# Patient Record
Sex: Female | Born: 1955
Health system: Southern US, Community
[De-identification: ages and names within clinical notes are randomized; demographics above are authoritative.]

## PROBLEM LIST (undated history)

## (undated) DIAGNOSIS — E785 Hyperlipidemia, unspecified: Secondary | ICD-10-CM

## (undated) DIAGNOSIS — E559 Vitamin D deficiency, unspecified: Secondary | ICD-10-CM

## (undated) DIAGNOSIS — I10 Essential (primary) hypertension: Secondary | ICD-10-CM

## (undated) DIAGNOSIS — H269 Unspecified cataract: Secondary | ICD-10-CM

## (undated) DIAGNOSIS — M8580 Other specified disorders of bone density and structure, unspecified site: Secondary | ICD-10-CM

## (undated) DIAGNOSIS — T7840XA Allergy, unspecified, initial encounter: Secondary | ICD-10-CM

## (undated) HISTORY — DX: Essential (primary) hypertension: I10

## (undated) HISTORY — PX: EYE SURGERY: SHX253

## (undated) HISTORY — DX: Allergy, unspecified, initial encounter: T78.40XA

## (undated) HISTORY — DX: Hyperlipidemia, unspecified: E78.5

## (undated) HISTORY — PX: NO PAST SURGERIES: SHX2092

## (undated) HISTORY — DX: Other specified disorders of bone density and structure, unspecified site: M85.80

## (undated) HISTORY — DX: Vitamin D deficiency, unspecified: E55.9

## (undated) HISTORY — DX: Unspecified cataract: H26.9

---

## 2015-12-25 DIAGNOSIS — H5203 Hypermetropia, bilateral: Secondary | ICD-10-CM | POA: Diagnosis not present

## 2015-12-25 DIAGNOSIS — H2513 Age-related nuclear cataract, bilateral: Secondary | ICD-10-CM | POA: Diagnosis not present

## 2016-07-24 ENCOUNTER — Encounter (INDEPENDENT_AMBULATORY_CARE_PROVIDER_SITE_OTHER): Payer: Self-pay

## 2016-07-24 ENCOUNTER — Ambulatory Visit (INDEPENDENT_AMBULATORY_CARE_PROVIDER_SITE_OTHER): Payer: BLUE CROSS/BLUE SHIELD | Admitting: Pediatrics

## 2016-07-24 ENCOUNTER — Encounter: Payer: Self-pay | Admitting: Pediatrics

## 2016-07-24 VITALS — BP 156/83 | HR 78 | Temp 97.3°F | Ht 64.0 in | Wt 218.6 lb

## 2016-07-24 DIAGNOSIS — K1379 Other lesions of oral mucosa: Secondary | ICD-10-CM

## 2016-07-24 DIAGNOSIS — Z1159 Encounter for screening for other viral diseases: Secondary | ICD-10-CM | POA: Diagnosis not present

## 2016-07-24 DIAGNOSIS — Z1231 Encounter for screening mammogram for malignant neoplasm of breast: Secondary | ICD-10-CM | POA: Diagnosis not present

## 2016-07-24 DIAGNOSIS — K1370 Unspecified lesions of oral mucosa: Secondary | ICD-10-CM

## 2016-07-24 DIAGNOSIS — Z6837 Body mass index (BMI) 37.0-37.9, adult: Secondary | ICD-10-CM

## 2016-07-24 DIAGNOSIS — I10 Essential (primary) hypertension: Secondary | ICD-10-CM | POA: Diagnosis not present

## 2016-07-24 DIAGNOSIS — Z1239 Encounter for other screening for malignant neoplasm of breast: Secondary | ICD-10-CM

## 2016-07-24 LAB — BAYER DCA HB A1C WAIVED: HB A1C: 5.9 % (ref <7.0)

## 2016-07-24 MED ORDER — LOSARTAN POTASSIUM 50 MG PO TABS: 50.0000 mg | ORAL_TABLET | Freq: Every day | ORAL | 3 refills | Status: DC

## 2016-07-24 NOTE — Progress Notes (Addendum)
Subjective:   Patient ID: Tammy Lawson, female    DOB: 07/03/1955, 61 y.o.   MRN: [id number] CC: New Patient (Initial Visit) f/u multiple med problems HPI: Tammy Lawson is a 61 y.o. female presenting for New Patient (Initial Visit)  Elevated blood pressure: At home 140s/70s regularly Checking about once a week No HA, no CP, SOB Never bene on medication before Has been some years since last saw a doctor  Never had colonoscopy Not getting mammograms regularly Due for pap smear  Elevated BMI: Decreasing salt for husband  Has bump on upper gum R upper side of mouth Was tender at first, throbbing Has h/o sinus problems Was seen by dentist, told she had LAD, that has now improved Bump still there, still throbs sometimes, is not tender when pressed on  Dad with sinus tumor, prostate, colon cancer in his 36s, lung cancer Sister with stage IV cancer, possibly lung cancer primary  No past medical history on file. Family History  Problem Relation Age of Onset  . Diabetes Mother   . Heart disease Mother   . Stroke Mother   . Cancer Father   . COPD Father   . Hypertension Father   . Cancer Sister    Social History   Social History  . Marital status: Married    Spouse name: N/A  . Number of children: N/A  . Years of education: N/A   Social History Main Topics  . Smoking status: Never Smoker  . Smokeless tobacco: Never Used  . Alcohol use No  . Drug use: No  . Sexual activity: Not on file   Other Topics Concern  . Not on file   Social History Narrative  . No narrative on file   ROS: All systems negative other than what is in HPI  Objective:    BP (!) 156/83   Pulse 78   Temp 97.3 F (36.3 C) (Oral)   Ht 5' 4" (1.626 m)   Wt 218 lb 9.6 oz (99.2 kg)   BMI 37.52 kg/m   Wt Readings from Last 3 Encounters:  07/24/16 218 lb 9.6 oz (99.2 kg)    Gen: NAD, alert, cooperative with exam, NCAT EYES: EOMI, no conjunctival injection, or no icterus ENT:  TMs pearly  gray b/l, more dull L side, OP without erythema, R upper gum over outside of teeth with apprx 0.5 cm hard mass, non-tender, mucosa intact LYMPH: no cervical LAD CV: NRRR, normal S1/S2, no murmur, distal pulses 2+ b/l Resp: CTABL, no wheezes, normal WOB Ext: No edema, warm Neuro: Alert and oriented, strength equal b/l UE and LE, coordination grossly normal  Assessment & Plan:  Tammy Lawson was seen today for new patient (initial visit).  Diagnoses and all orders for this visit:  Essential hypertension Elevated BP today Start below, check BPs at home -     losartan (COZAAR) 50 MG tablet; Take 1 tablet (50 mg total) by mouth daily.  BMI 37.0-37.9, adult Discussed lifestyle changes, increasing activity, decreasing sugar intake -     Lipid panel -     BMP8+EGFR -     Bayer DCA Hb A1c Waived -     TSH  Screening for breast cancer -     MM DIGITAL SCREENING BILATERAL; Future  Need for hepatitis C screening test -     Hepatitis C antibody  Mass of mouth Present for several weeks Throbbing at times -     Ambulatory referral to oral surgeon based on pt pref,  dentist recommendations   Follow up plan: Return in about 4 weeks (around 08/21/2016) for CPE. Assunta Found, MD [place]

## 2016-07-25 ENCOUNTER — Telehealth: Payer: Self-pay | Admitting: Pediatrics

## 2016-07-25 DIAGNOSIS — K068 Other specified disorders of gingiva and edentulous alveolar ridge: Secondary | ICD-10-CM

## 2016-07-25 LAB — LIPID PANEL
CHOL/HDL RATIO: 5.1 ratio — AB (ref 0.0–4.4)
Cholesterol, Total: 195 mg/dL (ref 100–199)
HDL: 38 mg/dL — AB (ref >39)
LDL CALC: 130 mg/dL — AB (ref 0–99)
Triglycerides: 135 mg/dL (ref 0–149)
VLDL CHOLESTEROL CAL: 27 mg/dL (ref 5–40)

## 2016-07-25 LAB — BMP8+EGFR
BUN / CREAT RATIO: 17 (ref 12–28)
BUN: 12 mg/dL (ref 8–27)
CO2: 25 mmol/L (ref 18–29)
Calcium: 9.5 mg/dL (ref 8.7–10.3)
Chloride: 100 mmol/L (ref 96–106)
Creatinine, Ser: 0.7 mg/dL (ref 0.57–1.00)
GFR calc Af Amer: 108 mL/min/{1.73_m2} (ref >59)
GFR calc non Af Amer: 94 mL/min/{1.73_m2} (ref >59)
Glucose: 107 mg/dL — ABNORMAL HIGH (ref 65–99)
POTASSIUM: 4.4 mmol/L (ref 3.5–5.2)
SODIUM: 142 mmol/L (ref 134–144)

## 2016-07-25 LAB — TSH: TSH: 0.542 u[IU]/mL (ref 0.450–4.500)

## 2016-07-25 LAB — HEPATITIS C ANTIBODY

## 2016-07-25 NOTE — Addendum Note (Signed)
Addended by: Eustaquio Maize on: 07/25/2016 01:44 PM   Modules accepted: Orders

## 2016-07-25 NOTE — Telephone Encounter (Signed)
ordered

## 2016-08-06 DIAGNOSIS — D164 Benign neoplasm of bones of skull and face: Secondary | ICD-10-CM | POA: Diagnosis not present

## 2016-08-26 ENCOUNTER — Ambulatory Visit (INDEPENDENT_AMBULATORY_CARE_PROVIDER_SITE_OTHER): Payer: BLUE CROSS/BLUE SHIELD | Admitting: Pediatrics

## 2016-08-26 ENCOUNTER — Encounter: Payer: Self-pay | Admitting: Pediatrics

## 2016-08-26 VITALS — BP 137/75 | HR 87 | Temp 97.1°F | Ht 64.0 in | Wt 213.6 lb

## 2016-08-26 DIAGNOSIS — Z0000 Encounter for general adult medical examination without abnormal findings: Secondary | ICD-10-CM

## 2016-08-26 DIAGNOSIS — N898 Other specified noninflammatory disorders of vagina: Secondary | ICD-10-CM

## 2016-08-26 DIAGNOSIS — N939 Abnormal uterine and vaginal bleeding, unspecified: Secondary | ICD-10-CM

## 2016-08-26 DIAGNOSIS — Z1211 Encounter for screening for malignant neoplasm of colon: Secondary | ICD-10-CM

## 2016-08-26 LAB — WET PREP FOR TRICH, YEAST, CLUE
CLUE CELL EXAM: NEGATIVE
TRICHOMONAS EXAM: NEGATIVE
YEAST EXAM: NEGATIVE

## 2016-08-26 NOTE — Progress Notes (Signed)
  Subjective:   Patient ID: Tammy Lawson, female    DOB: 01/13/1956, 61 y.o.   MRN: [id number] CC: Annual Exam  HPI: Tammy Lawson is a 61 y.o. female presenting for Annual Exam  Cyst in mouth taken care of, had root canal done, told not cancer Mammo: appt set up July 30 Pap smear: no hx of abnormal, last 15-20 yrs Has noticed some vaginal discharge, white, no itchiness, no smell Not sexually active now Not worried about STIs Has never had regular periods Now spotting almost weekly for 1-2 days Previously was having periods every 2-3 months Thinks she went through menopause several years ago   HTN: at home BPs 120s/70s As low as 107/69 Checks regularly at home  No fam hx of breast ca, dad had colon ca in 80s, pt does not want colonoscopy  Not eating ice cream eating every, sticking with half a cup of ice cream  Relevant past medical, surgical, family and social history reviewed. Allergies and medications reviewed and updated. History  Smoking Status  . Never Smoker  Smokeless Tobacco  . Never Used   ROS: All systems negative other than what is in HPI  Objective:    BP 137/75   Pulse 87   Temp 97.1 F (36.2 C) (Oral)   Ht 5\' 4"  (1.626 m)   Wt 213 lb 9.6 oz (96.9 kg)   BMI 36.66 kg/m   Wt Readings from Last 3 Encounters:  08/26/16 213 lb 9.6 oz (96.9 kg)  07/24/16 218 lb 9.6 oz (99.2 kg)    Gen: NAD, alert, cooperative with exam, NCAT EYES: EOMI, no conjunctival injection, or no icterus ENT:  TMs pearly gray b/l, OP without erythema LYMPH: no cervical LAD CV: NRRR, normal S1/S2, no murmur, distal pulses 2+ b/l Resp: CTABL, no wheezes, normal WOB Abd: +BS, soft, NTND. no guarding or organomegaly Ext: No edema, warm Neuro: Alert and oriented, strength equal b/l UE and LE, coordination grossly normal MSK: normal muscle bulk Breast: nl b/l GU: nl external female genitalia, normal appearing cervix, small amount green discharge, slight amount blood at cervical  os  Assessment & Plan:  Tammy Lawson was seen today for annual exam.  Diagnoses and all orders for this visit:  Encounter for preventive health examination Mammogram upcoming Declined colonoscopy, doing FIT test as below -     Pap IG and HPV (high risk) DNA detection  Vaginal discharge -     WET PREP FOR [place], YEAST, CLUE  Vaginal bleeding -     Ambulatory referral to Obstetrics / Gynecology  Colon cancer screening -     Fecal occult blood, imunochemical; Future   Follow up plan: Return in about 6 months (around 02/25/2017). Assunta Found, MD [place]

## 2016-08-28 LAB — PAP IG AND HPV HIGH-RISK
HPV, high-risk: NEGATIVE
PAP Smear Comment: 0

## 2016-09-05 ENCOUNTER — Encounter: Payer: Self-pay | Admitting: Obstetrics & Gynecology

## 2016-09-14 ENCOUNTER — Ambulatory Visit (INDEPENDENT_AMBULATORY_CARE_PROVIDER_SITE_OTHER): Payer: BLUE CROSS/BLUE SHIELD | Admitting: Nurse Practitioner

## 2016-09-14 ENCOUNTER — Encounter: Payer: Self-pay | Admitting: Nurse Practitioner

## 2016-09-14 VITALS — BP 130/82 | HR 80 | Temp 100.9°F | Ht 64.0 in | Wt 209.0 lb

## 2016-09-14 DIAGNOSIS — J029 Acute pharyngitis, unspecified: Secondary | ICD-10-CM | POA: Diagnosis not present

## 2016-09-14 DIAGNOSIS — J020 Streptococcal pharyngitis: Secondary | ICD-10-CM | POA: Diagnosis not present

## 2016-09-14 MED ORDER — AMOXICILLIN 875 MG PO TABS: 875.0000 mg | ORAL_TABLET | Freq: Two times a day (BID) | ORAL | 0 refills | Status: DC

## 2016-09-14 NOTE — Patient Instructions (Signed)

## 2016-09-14 NOTE — Progress Notes (Signed)
   Subjective:    Patient ID: Tammy Lawson, female    DOB: 05-04-1956, 61 y.o.   MRN: [id number]  HPI Patient is brought in by hr husband today with c/o sore troat for 2 days. Ran a fever last night of 101.2.    Review of Systems  Constitutional: Positive for appetite change (decreased), chills and fever.  HENT: Positive for congestion, sore throat, trouble swallowing and voice change (froggy). Negative for ear pain and sinus pressure.   Respiratory: Positive for cough (mild nonproductive cough).   Cardiovascular: Negative.   Genitourinary: Negative.   Neurological: Negative.   Psychiatric/Behavioral: Negative.        Objective:   Physical Exam  Constitutional: She is oriented to person, place, and time. She appears well-developed and well-nourished. No distress.  HENT:  Right Ear: Hearing, tympanic membrane, external ear and ear canal normal.  Left Ear: Hearing, tympanic membrane, external ear and ear canal normal.  Nose: Mucosal edema and rhinorrhea present. Right sinus exhibits no maxillary sinus tenderness and no frontal sinus tenderness. Left sinus exhibits no maxillary sinus tenderness and no frontal sinus tenderness.  Mouth/Throat: Uvula is midline. Posterior oropharyngeal erythema present.  Neck: Normal range of motion. Neck supple.  Cardiovascular: Normal rate and regular rhythm.   Pulmonary/Chest: Effort normal and breath sounds normal.  Lymphadenopathy:    She has cervical adenopathy (anterior cervical bil).  Neurological: She is alert and oriented to person, place, and time.  Skin: Skin is warm.  Psychiatric: She has a normal mood and affect. Her behavior is normal. Judgment and thought content normal.   BP 130/82   Pulse 80   Temp (!) 100.9 F (38.3 C) (Oral)   Ht 5\' 4"  (1.626 m)   Wt 209 lb (94.8 kg)   BMI 35.87 kg/m   Strep test -positive     Assessment & Plan:   1. Sore throat   2. Strep pharyngitis    Meds ordered this encounter  Medications  .  amoxicillin (AMOXIL) 875 MG tablet    Sig: Take 1 tablet (875 mg total) by mouth 2 (two) times daily. 1 po BID    Dispense:  20 tablet    Refill:  0    Order Specific Question:   Supervising Provider    Answer:   Eustaquio Maize [[id number]]   Force fluids Motrin or tylenol OTC OTC decongestant Throat lozenges if help New toothbrush in 3 days  Mary-Margaret Hassell Done, FNP

## 2016-09-16 LAB — RAPID STREP SCREEN (MED CTR MEBANE ONLY): Strep Gp A Ag, IA W/Reflex: POSITIVE — AB

## 2016-09-17 ENCOUNTER — Telehealth: Payer: Self-pay | Admitting: Pediatrics

## 2016-09-17 NOTE — Telephone Encounter (Signed)
Patient aware.

## 2016-09-17 NOTE — Telephone Encounter (Signed)
Patient's throat is feeling better, but she is still running a slight fever of 100.4.  She is taking medication for the fever.  Do you recommend she make any changes?

## 2016-09-17 NOTE — Telephone Encounter (Signed)
No just continue antibiotic as rx and take motrin for fever

## 2016-09-19 ENCOUNTER — Ambulatory Visit (INDEPENDENT_AMBULATORY_CARE_PROVIDER_SITE_OTHER): Payer: BLUE CROSS/BLUE SHIELD | Admitting: Nurse Practitioner

## 2016-09-19 ENCOUNTER — Encounter: Payer: Self-pay | Admitting: Nurse Practitioner

## 2016-09-19 VITALS — HR 113 | Temp 99.4°F | Ht 64.0 in | Wt 208.0 lb

## 2016-09-19 DIAGNOSIS — J020 Streptococcal pharyngitis: Secondary | ICD-10-CM | POA: Diagnosis not present

## 2016-09-19 DIAGNOSIS — R509 Fever, unspecified: Secondary | ICD-10-CM

## 2016-09-19 LAB — VERITOR FLU A/B WAIVED
INFLUENZA B: NEGATIVE
Influenza A: NEGATIVE

## 2016-09-19 MED ORDER — CEFDINIR 300 MG PO CAPS: 300.0000 mg | ORAL_CAPSULE | Freq: Two times a day (BID) | ORAL | 0 refills | Status: DC

## 2016-09-19 NOTE — Progress Notes (Signed)
   Subjective:    Patient ID: [name], female    DOB: 1956/03/09, 61 y.o.   MRN: [id number]  HPI Patient was seen on 09/14/16 with sore throat and fever- tested positive for strep and was put on amoxicillin. Today she comes in with c/o still running fever- feesl achy- Several years ago she had strep and she had to take 2 roundsof antibiotic for it to resolve.   Review of Systems  Constitutional: Positive for chills, fatigue and fever.  HENT: Positive for congestion, rhinorrhea and sore throat (improved). Negative for sinus pain and sinus pressure.   Respiratory: Positive for cough (only slight).   Cardiovascular: Negative.   Gastrointestinal: Negative.   Genitourinary: Negative.   Neurological: Negative.   Psychiatric/Behavioral: Negative.   All other systems reviewed and are negative.      Objective:   Physical Exam  Constitutional: She appears well-developed and well-nourished. No distress.  HENT:  Right Ear: External ear normal.  Left Ear: External ear normal.  Nose: Nose normal.  Mouth/Throat: Oropharynx is clear and moist. No oropharyngeal exudate.  Neck: Normal range of motion. Neck supple.  Cardiovascular: Normal rate and regular rhythm.   Pulmonary/Chest: Effort normal and breath sounds normal.  Abdominal: Soft. Bowel sounds are normal.  Lymphadenopathy:    She has no cervical adenopathy.  Psychiatric: She has a normal mood and affect. Her behavior is normal. Judgment and thought content normal.    Pulse (!) 113   Temp 99.4 F (37.4 C) (Oral)   Ht 5\' 4"  (1.626 m)   Wt 208 lb (94.3 kg)   BMI 35.70 kg/m   Flu negative     Assessment & Plan:   1. Fever, unspecified fever cause   2. Strep pharyngitis    Stop amoxicillin Meds ordered this encounter  Medications  . cefdinir (OMNICEF) 300 MG capsule    Sig: Take 1 capsule (300 mg total) by mouth 2 (two) times daily. 1 po BID    Dispense:  20 capsule    Refill:  0    Order Specific Question:   Supervising  Provider    Answer:   Eustaquio Maize [[id number]]   Force fluids Motrin or tylenol OTC OTC decongestant Throat lozenges if help New toothbrush in 3 days RTO if not improving  Mary-Margaret Hassell Done, FNP

## 2016-10-15 ENCOUNTER — Encounter: Payer: BLUE CROSS/BLUE SHIELD | Admitting: Adult Health

## 2016-11-09 ENCOUNTER — Other Ambulatory Visit: Payer: Self-pay | Admitting: Pediatrics

## 2016-11-09 DIAGNOSIS — I10 Essential (primary) hypertension: Secondary | ICD-10-CM

## 2016-12-02 ENCOUNTER — Encounter: Payer: BLUE CROSS/BLUE SHIELD | Admitting: *Deleted

## 2016-12-02 DIAGNOSIS — Z1231 Encounter for screening mammogram for malignant neoplasm of breast: Secondary | ICD-10-CM | POA: Diagnosis not present

## 2016-12-20 ENCOUNTER — Telehealth: Payer: Self-pay | Admitting: *Deleted

## 2016-12-20 NOTE — Telephone Encounter (Signed)
pt was having abdominal pain from waist down that was coming and going and broke out in a sweat and felt nauseated. She also states her head felt "funny" like she might pass out but didn't. Called EMS and they told her she probably was dehydrated. Pt scheduled 8/20 at 10:30 with [facility] for eval. Advised to call 911 or ED if chest pain, SOB or feels like she may pass out. Pt voiced understanding.

## 2016-12-23 ENCOUNTER — Ambulatory Visit (INDEPENDENT_AMBULATORY_CARE_PROVIDER_SITE_OTHER): Payer: BLUE CROSS/BLUE SHIELD | Admitting: Nurse Practitioner

## 2016-12-23 ENCOUNTER — Encounter: Payer: Self-pay | Admitting: Nurse Practitioner

## 2016-12-23 VITALS — BP 136/82 | HR 62 | Temp 97.8°F | Ht 64.0 in | Wt 212.0 lb

## 2016-12-23 DIAGNOSIS — Z1211 Encounter for screening for malignant neoplasm of colon: Secondary | ICD-10-CM | POA: Diagnosis not present

## 2016-12-23 DIAGNOSIS — R109 Unspecified abdominal pain: Secondary | ICD-10-CM | POA: Diagnosis not present

## 2016-12-23 NOTE — Patient Instructions (Signed)

## 2016-12-23 NOTE — Progress Notes (Signed)
   Subjective:    Patient ID: Tammy Lawson, female    DOB: Mar 14, 1956, 61 y.o.   MRN: [id number]  HPI Patient comes in today c/o that Friday she was sitting out on her deck. All the sudden started having abdominal pain and sweating. Lasted about 45-to 1 hour. SHe called EMS and thay said blood pressure was 130/76. Blood sugar 102. They thought may have been heat exhaustion and they suggest she follow up today to make sure her heart was ok. Has had no more episodes since then. Feels fine today.    Review of Systems  Constitutional: Negative for activity change, appetite change, chills and fever.  Respiratory: Negative.  Negative for shortness of breath.   Cardiovascular: Negative for chest pain.  Gastrointestinal: Negative for abdominal distention, abdominal pain, constipation, diarrhea, nausea and vomiting.  Genitourinary: Negative.   Neurological: Negative.   Psychiatric/Behavioral: Negative.   All other systems reviewed and are negative.      Objective:   Physical Exam  Constitutional: She is oriented to person, place, and time. She appears well-developed and well-nourished. No distress.  Cardiovascular: Normal rate and regular rhythm.   Pulmonary/Chest: Effort normal and breath sounds normal.  Abdominal: Soft. Bowel sounds are normal.  Neurological: She is alert and oriented to person, place, and time.  Skin: Skin is warm.  Psychiatric: She has a normal mood and affect. Her behavior is normal. Judgment and thought content normal.   BP 136/82 (BP Location: Left Arm, Cuff Size: Normal)   Pulse 62   Temp 97.8 F (36.6 C) (Oral)   Ht 5\' 4"  (1.626 m)   Wt 212 lb (96.2 kg)   SpO2 100%   BMI 36.39 kg/m   EKG- NSR with right Cruz Condon, FNP       Assessment & Plan:   1. Abdominal pain, unspecified abdominal location   2. Colon cancer screening    Pain resolved- if reoccurs go to ER or RTO Avoid greasy and fatty foods Hemoccult cards returned today Follow up  prn  Mary-Margaret Hassell Done, FNP

## 2016-12-26 LAB — FECAL OCCULT BLOOD, IMMUNOCHEMICAL: FECAL OCCULT BLD: NEGATIVE

## 2017-01-21 ENCOUNTER — Encounter: Payer: BLUE CROSS/BLUE SHIELD | Admitting: Adult Health

## 2017-02-20 ENCOUNTER — Ambulatory Visit (INDEPENDENT_AMBULATORY_CARE_PROVIDER_SITE_OTHER): Payer: BLUE CROSS/BLUE SHIELD | Admitting: Pediatrics

## 2017-02-20 ENCOUNTER — Encounter: Payer: Self-pay | Admitting: Pediatrics

## 2017-02-20 VITALS — BP 146/81 | HR 84 | Temp 97.7°F | Ht 64.0 in | Wt 211.8 lb

## 2017-02-20 DIAGNOSIS — R7303 Prediabetes: Secondary | ICD-10-CM | POA: Diagnosis not present

## 2017-02-20 DIAGNOSIS — R1011 Right upper quadrant pain: Secondary | ICD-10-CM

## 2017-02-20 DIAGNOSIS — Z6836 Body mass index (BMI) 36.0-36.9, adult: Secondary | ICD-10-CM | POA: Diagnosis not present

## 2017-02-20 DIAGNOSIS — I10 Essential (primary) hypertension: Secondary | ICD-10-CM

## 2017-02-20 DIAGNOSIS — Z6835 Body mass index (BMI) 35.0-35.9, adult: Secondary | ICD-10-CM

## 2017-02-20 LAB — CMP14+EGFR
ALBUMIN: 4.6 g/dL (ref 3.6–4.8)
ALT: 16 IU/L (ref 0–32)
AST: 20 IU/L (ref 0–40)
Albumin/Globulin Ratio: 1.8 (ref 1.2–2.2)
Alkaline Phosphatase: 81 IU/L (ref 39–117)
BUN / CREAT RATIO: 16 (ref 12–28)
BUN: 11 mg/dL (ref 8–27)
Bilirubin Total: 0.5 mg/dL (ref 0.0–1.2)
CALCIUM: 9.5 mg/dL (ref 8.7–10.3)
CO2: 25 mmol/L (ref 20–29)
CREATININE: 0.68 mg/dL (ref 0.57–1.00)
Chloride: 103 mmol/L (ref 96–106)
GFR calc Af Amer: 108 mL/min/{1.73_m2} (ref >59)
GFR, EST NON AFRICAN AMERICAN: 94 mL/min/{1.73_m2} (ref >59)
GLOBULIN, TOTAL: 2.5 g/dL (ref 1.5–4.5)
Glucose: 95 mg/dL (ref 65–99)
Potassium: 4.6 mmol/L (ref 3.5–5.2)
SODIUM: 142 mmol/L (ref 134–144)
Total Protein: 7.1 g/dL (ref 6.0–8.5)

## 2017-02-20 LAB — CBC WITH DIFFERENTIAL/PLATELET
Basophils Absolute: 0 10*3/uL (ref 0.0–0.2)
Basos: 1 %
EOS (ABSOLUTE): 0.1 10*3/uL (ref 0.0–0.4)
EOS: 2 %
HEMATOCRIT: 41.7 % (ref 34.0–46.6)
HEMOGLOBIN: 13.5 g/dL (ref 11.1–15.9)
IMMATURE GRANS (ABS): 0 10*3/uL (ref 0.0–0.1)
IMMATURE GRANULOCYTES: 0 %
LYMPHS ABS: 1.4 10*3/uL (ref 0.7–3.1)
LYMPHS: 23 %
MCH: 29.4 pg (ref 26.6–33.0)
MCHC: 32.4 g/dL (ref 31.5–35.7)
MCV: 91 fL (ref 79–97)
MONOCYTES: 5 %
Monocytes Absolute: 0.3 10*3/uL (ref 0.1–0.9)
NEUTROS PCT: 69 %
Neutrophils Absolute: 4.2 10*3/uL (ref 1.4–7.0)
Platelets: 246 10*3/uL (ref 150–379)
RBC: 4.59 x10E6/uL (ref 3.77–5.28)
RDW: 14 % (ref 12.3–15.4)
WBC: 6 10*3/uL (ref 3.4–10.8)

## 2017-02-20 MED ORDER — LOSARTAN POTASSIUM 50 MG PO TABS: 50.0000 mg | ORAL_TABLET | Freq: Every day | ORAL | 1 refills | Status: DC

## 2017-02-20 NOTE — Progress Notes (Signed)
  Subjective:   Patient ID: Tammy Lawson, female    DOB: March 28, 1956, 61 y.o.   MRN: [id number] CC: Follow-up (6 month) multiple med problems  HPI: Tammy Lawson is a 61 y.o. female presenting for Follow-up (6 month)  HTN: No HA, no vision changes Checks BPs regularly at home Usually 110s-120s Sometimes 140 SBP initially, within 5 mints 130s or less with rechecking Taking med regularly  Elevated BMI:  Gardens regularly [employer] grandson Not drinking sodas Cutting out ice cream, replacing it with fruit  Has a pain/discomfort in R side comes on at random times, first noticed when pregnant with son, has felt the same off and on since then Worse with greasy foods but not always associated with food Called EMS 2 mo ago for mid abd pain that lasted about 3 hrs, started at 11am, not associated with food Seen in clinic following event Denies chest pain, SOB with exertion No fevers Pain happens about once a month, ongoing for about a year  Relevant past medical, surgical, family and social history reviewed. Allergies and medications reviewed and updated. History  Smoking Status  . Never Smoker  Smokeless Tobacco  . Never Used   ROS: Per HPI   Objective:    BP (!) 143/83   Pulse 65   Temp 97.7 F (36.5 C) (Oral)   Ht '5\' 4"'$  (1.626 m)   Wt 211 lb 12.8 oz (96.1 kg)   BMI 36.36 kg/m   Wt Readings from Last 3 Encounters:  02/20/17 211 lb 12.8 oz (96.1 kg)  12/23/16 212 lb (96.2 kg)  09/19/16 208 lb (94.3 kg)    Gen: NAD, alert, cooperative with exam, NCAT EYES: EOMI, no conjunctival injection, or no icterus CV: NRRR, normal S1/S2, no murmur, distal pulses 2+ b/l Resp: CTABL, no wheezes, normal WOB Abd: +BS, soft, NTND. no guarding or organomegaly, neg murphys sign Ext: No edema, warm Neuro: Alert and oriented, strength equal b/l UE and LE, coordination grossly normal, normal gait MSK: normal muscle bulk  Assessment & Plan:  Tammy Lawson was seen today for follow-up med problems,  abd pain  Diagnoses and all orders for this visit:  RUQ pain Intermittent, bothering her more often, associated with food at times Check blood work -     US Abdomen Limited RUQ; Future -     CBC with Differential/Platelet -     CMP14+EGFR  Essential hypertension Elevated today, normal numbers at home Cont current meds -     losartan (COZAAR) 50 MG tablet; Take 1 tablet (50 mg total) by mouth daily.  Prediabetes BMI 36.0-36.9,adult Weight stable Cont lifestyle changes, wt loss  Follow up plan: Return in about 3 months (around 05/23/2017). Assunta Found, MD [place]

## 2017-02-25 ENCOUNTER — Ambulatory Visit (HOSPITAL_COMMUNITY)
Admission: RE | Admit: 2017-02-25 | Discharge: 2017-02-25 | Disposition: A | Discharge status: Home / Self Care | Payer: BLUE CROSS/BLUE SHIELD | Source: Ambulatory Visit | Attending: Pediatrics | Admitting: Pediatrics

## 2017-02-25 DIAGNOSIS — K760 Fatty (change of) liver, not elsewhere classified: Secondary | ICD-10-CM | POA: Insufficient documentation

## 2017-02-25 DIAGNOSIS — R1011 Right upper quadrant pain: Secondary | ICD-10-CM | POA: Diagnosis not present

## 2017-05-23 ENCOUNTER — Ambulatory Visit: Payer: BLUE CROSS/BLUE SHIELD | Admitting: Pediatrics

## 2017-05-23 ENCOUNTER — Encounter: Payer: Self-pay | Admitting: Pediatrics

## 2017-05-23 VITALS — BP 138/78 | HR 78 | Temp 97.0°F | Ht 64.0 in | Wt 206.2 lb

## 2017-05-23 DIAGNOSIS — J329 Chronic sinusitis, unspecified: Secondary | ICD-10-CM | POA: Diagnosis not present

## 2017-05-23 DIAGNOSIS — I10 Essential (primary) hypertension: Secondary | ICD-10-CM

## 2017-05-23 DIAGNOSIS — Z6835 Body mass index (BMI) 35.0-35.9, adult: Secondary | ICD-10-CM | POA: Diagnosis not present

## 2017-05-23 MED ORDER — LOSARTAN POTASSIUM 50 MG PO TABS: 50.0000 mg | ORAL_TABLET | Freq: Every day | ORAL | 1 refills | Status: DC

## 2017-05-23 MED ORDER — AMOXICILLIN-POT CLAVULANATE 875-125 MG PO TABS: 1.0000 | ORAL_TABLET | Freq: Two times a day (BID) | ORAL | 0 refills | Status: DC

## 2017-05-23 NOTE — Progress Notes (Signed)
  Subjective:   Patient ID: Tammy Lawson, female    DOB: 12/05/1955, 62 y.o.   MRN: [id number] CC: Follow-up (3 month) med problems HPI: Tammy Lawson is a 62 y.o. female presenting for Follow-up (3 month)  HTN: SBP at home 120s/70s No HA, no shortness of breath, no chest pain  RUQ pain: minimal since last visit  Elevated BMI/prediabetes Doing fitness pal, walking 15 min daily at least Avoiding sugary foods.  Has had a week of nasal discharge, some worsening.  Has been using a Nettie pot regularly, has lots of green and yellow discharge coming out No fevers, appetite is been okay  Relevant past medical, surgical, family and social history reviewed. Allergies and medications reviewed and updated. Social History   Tobacco Use  Smoking Status Never Smoker  Smokeless Tobacco Never Used   ROS: Per HPI   Objective:    BP 138/78   Pulse 78   Temp (!) 97 F (36.1 C) (Oral)   Ht 5\' 4"  (1.626 m)   Wt 206 lb 3.2 oz (93.5 kg)   BMI 35.39 kg/m   Wt Readings from Last 3 Encounters:  05/23/17 206 lb 3.2 oz (93.5 kg)  02/20/17 211 lb 12.8 oz (96.1 kg)  12/23/16 212 lb (96.2 kg)    Gen: NAD, alert, cooperative with exam, NCAT EYES: EOMI, no conjunctival injection, or no icterus ENT:  TMs dull gray b/l, OP without erythema LYMPH: no cervical LAD CV: NRRR, normal S1/S2, no murmur, distal pulses 2+ b/l Resp: CTABL, no wheezes, normal WOB Abd: +BS, soft, NTND. no guarding or organomegaly Ext: No edema, warm Neuro: Alert and oriented, strength equal b/l UE and LE, coordination grossly normal MSK: normal muscle bulk  Assessment & Plan:  [name] was seen today for follow-up medical problems  Diagnoses and all orders for this visit:  Sinusitis, unspecified chronicity, unspecified location Start antibiotic if she starts to worsen over the next few days.  Suspect this is acute URI.  Continue sinus rinses. -     amoxicillin-clavulanate (AUGMENTIN) 875-125 MG tablet; Take 1 tablet by mouth  2 (two) times daily.  Essential hypertension -     losartan (COZAAR) 50 MG tablet; Take 1 tablet (50 mg total) by mouth daily.  BMI 35.0-35.9,adult Down about 5 pounds, continue lifestyle changes, increase physical activity  Follow up plan: Return in about 6 months (around 11/20/2017). Assunta Found, MD [place]

## 2017-07-24 DIAGNOSIS — H16223 Keratoconjunctivitis sicca, not specified as Sjogren's, bilateral: Secondary | ICD-10-CM | POA: Diagnosis not present

## 2017-07-24 DIAGNOSIS — H2513 Age-related nuclear cataract, bilateral: Secondary | ICD-10-CM | POA: Diagnosis not present

## 2017-09-16 DIAGNOSIS — N390 Urinary tract infection, site not specified: Secondary | ICD-10-CM | POA: Diagnosis not present

## 2017-09-16 DIAGNOSIS — R319 Hematuria, unspecified: Secondary | ICD-10-CM | POA: Diagnosis not present

## 2017-11-15 ENCOUNTER — Other Ambulatory Visit: Payer: Self-pay | Admitting: Pediatrics

## 2017-11-15 DIAGNOSIS — I10 Essential (primary) hypertension: Secondary | ICD-10-CM

## 2017-11-17 NOTE — Telephone Encounter (Signed)
Last seen 05/23/17  Dr Evette Doffing

## 2017-11-20 ENCOUNTER — Ambulatory Visit: Payer: BLUE CROSS/BLUE SHIELD | Admitting: Pediatrics

## 2017-11-24 ENCOUNTER — Ambulatory Visit: Payer: BLUE CROSS/BLUE SHIELD | Admitting: Pediatrics

## 2017-12-26 ENCOUNTER — Encounter: Payer: Self-pay | Admitting: Pediatrics

## 2017-12-26 ENCOUNTER — Ambulatory Visit: Payer: BLUE CROSS/BLUE SHIELD | Admitting: Pediatrics

## 2017-12-26 VITALS — BP 135/76 | HR 71 | Temp 97.3°F | Ht 64.0 in | Wt 201.8 lb

## 2017-12-26 DIAGNOSIS — I10 Essential (primary) hypertension: Secondary | ICD-10-CM

## 2017-12-26 DIAGNOSIS — Z1211 Encounter for screening for malignant neoplasm of colon: Secondary | ICD-10-CM | POA: Diagnosis not present

## 2017-12-26 DIAGNOSIS — R7303 Prediabetes: Secondary | ICD-10-CM

## 2017-12-26 LAB — BAYER DCA HB A1C WAIVED: HB A1C: 5.4 % (ref <7.0)

## 2017-12-26 MED ORDER — LOSARTAN POTASSIUM 50 MG PO TABS: ORAL_TABLET | ORAL | 1 refills | Status: DC

## 2017-12-26 NOTE — Progress Notes (Signed)
  Subjective:   Patient ID: Tammy Lawson, female    DOB: May 06, 1956, 62 y.o.   MRN: [id number] CC: Medical Management of Chronic Issues  HPI: Tammy Lawson is a 62 y.o. female   Hypertension: Taking her medicine regularly.  Blood pressures at home 110s rarely up to 130s.  No shortness of breath or chest pain.  Regularly active with her grandchildren.  Elevated BMI: Working on decreasing sugary foods, high salt foods.  Has been pleased with weight loss so far.  Due for colonoscopy, patient declines.  Relevant past medical, surgical, family and social history reviewed. Allergies and medications reviewed and updated. Social History   Tobacco Use  Smoking Status Never Smoker  Smokeless Tobacco Never Used   ROS: Per HPI   Objective:    BP 135/76   Pulse 71   Temp (!) 97.3 F (36.3 C) (Oral)   Ht _0  (1.626 m)   Wt 201 lb 12.8 oz (91.5 kg)   BMI 34.64 kg/m   Wt Readings from Last 3 Encounters:  12/26/17 201 lb 12.8 oz (91.5 kg)  05/23/17 206 lb 3.2 oz (93.5 kg)  02/20/17 211 lb 12.8 oz (96.1 kg)    Gen: NAD, alert, cooperative with exam, NCAT EYES: EOMI, no conjunctival injection, or no icterus CV: NRRR, normal S1/S2, no murmur, distal pulses 2+ b/l Resp: CTABL, no wheezes, normal WOB Abd: +BS, soft, NTND. no guarding or organomegaly Ext: No edema, warm Neuro: Alert and oriented, strength equal b/l UE and LE, coordination grossly normal MSK: normal muscle bulk  Assessment & Plan:  Tammy Lawson was seen today for medical management of chronic issues.  Diagnoses and all orders for this visit:  Essential hypertension Stable, continue below -     losartan (COZAAR) 50 MG tablet; TAKE 1 TABLET(50 MG) BY MOUTH DAILY -     BMP8+EGFR  Prediabetes -     Bayer DCA Hb A1c Waived -     TSH  Colon cancer screening -     Fecal occult blood, imunochemical; Future   Follow up plan: Return in about 6 months (around 06/28/2018). Assunta Found, MD [place]

## 2017-12-26 NOTE — Addendum Note (Signed)
Addended by: Wardell Heath on: 12/26/2017 11:55 AM   Modules accepted: Orders

## 2017-12-27 LAB — BMP8+EGFR
BUN/Creatinine Ratio: 25 (ref 12–28)
BUN: 18 mg/dL (ref 8–27)
CALCIUM: 9.5 mg/dL (ref 8.7–10.3)
CO2: 25 mmol/L (ref 20–29)
CREATININE: 0.72 mg/dL (ref 0.57–1.00)
Chloride: 101 mmol/L (ref 96–106)
GFR calc Af Amer: 103 mL/min/{1.73_m2} (ref >59)
GFR calc non Af Amer: 89 mL/min/{1.73_m2} (ref >59)
GLUCOSE: 94 mg/dL (ref 65–99)
Potassium: 4.6 mmol/L (ref 3.5–5.2)
Sodium: 140 mmol/L (ref 134–144)

## 2017-12-27 LAB — TSH: TSH: 0.469 u[IU]/mL (ref 0.450–4.500)

## 2018-07-02 ENCOUNTER — Ambulatory Visit: Payer: BLUE CROSS/BLUE SHIELD | Admitting: Pediatrics

## 2018-07-02 ENCOUNTER — Ambulatory Visit: Payer: BLUE CROSS/BLUE SHIELD | Admitting: Family Medicine

## 2018-08-08 ENCOUNTER — Other Ambulatory Visit: Payer: Self-pay | Admitting: Pediatrics

## 2018-08-08 DIAGNOSIS — I10 Essential (primary) hypertension: Secondary | ICD-10-CM

## 2018-08-24 ENCOUNTER — Other Ambulatory Visit: Payer: Self-pay

## 2018-08-25 ENCOUNTER — Ambulatory Visit: Payer: BLUE CROSS/BLUE SHIELD | Admitting: Family Medicine

## 2018-08-25 ENCOUNTER — Encounter: Payer: Self-pay | Admitting: Family Medicine

## 2018-08-25 VITALS — BP 137/79 | HR 80 | Temp 97.8°F | Ht 64.0 in | Wt 210.0 lb

## 2018-08-25 DIAGNOSIS — I10 Essential (primary) hypertension: Secondary | ICD-10-CM

## 2018-08-25 DIAGNOSIS — Z6836 Body mass index (BMI) 36.0-36.9, adult: Secondary | ICD-10-CM | POA: Diagnosis not present

## 2018-08-25 DIAGNOSIS — Z6835 Body mass index (BMI) 35.0-35.9, adult: Secondary | ICD-10-CM | POA: Diagnosis not present

## 2018-08-25 DIAGNOSIS — R7303 Prediabetes: Secondary | ICD-10-CM

## 2018-08-25 LAB — BAYER DCA HB A1C WAIVED: HB A1C (BAYER DCA - WAIVED): 5.5 % (ref <7.0)

## 2018-08-25 MED ORDER — LOSARTAN POTASSIUM 50 MG PO TABS: ORAL_TABLET | ORAL | 1 refills | Status: DC

## 2018-08-25 NOTE — Progress Notes (Signed)
Subjective:  Patient ID: Tammy Lawson, female    DOB: 06/23/1955, 63 y.o.   MRN: [id number]  Chief Complaint:  Medical Management of Chronic Issues   HPI: Tammy Lawson is a 63 y.o. female presenting on 08/25/2018 for Medical Management of Chronic Issues   1. Essential hypertension  Complaint with meds - Yes Checking BP at home ranging 120/80 Exercising Regularly - No Watching Salt intake - Yes Pertinent ROS:  Headache - No Chest pain - No Dyspnea - No Palpitations - No LE edema - No They report good compliance with medications and can restate their regimen by memory. No medication side effects.  BP Readings from Last 3 Encounters:  08/25/18 137/79  12/26/17 135/76  05/23/17 138/78     2. Prediabetes  Diet controlled. Does not check blood sugar at home. No polyuria, polydipsia, or polyphagia.   3. BMI 36.0-36.9,adult  Tries to watch what she eats. Does not exercise on a regular basis.      Relevant past medical, surgical, family, and social history reviewed and updated as indicated.  Allergies and medications reviewed and updated.   History reviewed. No pertinent past medical history.  History reviewed. No pertinent surgical history.  Social History   Socioeconomic History  . Marital status: Married    Spouse name: Not on file  . Number of children: Not on file  . Years of education: Not on file  . Highest education level: Not on file  Occupational History  . Not on file  Social Needs  . Financial resource strain: Not on file  . Food insecurity:    Worry: Not on file    Inability: Not on file  . Transportation needs:    Medical: Not on file    Non-medical: Not on file  Tobacco Use  . Smoking status: Never Smoker  . Smokeless tobacco: Never Used  Substance and Sexual Activity  . Alcohol use: No  . Drug use: No  . Sexual activity: Not on file  Lifestyle  . Physical activity:    Days per week: Not on file    Minutes per session: Not on file  .  Stress: Not on file  Relationships  . Social connections:    Talks on phone: Not on file    Gets together: Not on file    Attends religious service: Not on file    Active member of club or organization: Not on file    Attends meetings of clubs or organizations: Not on file    Relationship status: Not on file  . Intimate partner violence:    Fear of current or ex partner: Not on file    Emotionally abused: Not on file    Physically abused: Not on file    Forced sexual activity: Not on file  Other Topics Concern  . Not on file  Social History Narrative  . Not on file    Outpatient Encounter Medications as of 08/25/2018  Medication Sig  . losartan (COZAAR) 50 MG tablet TAKE 1 TABLET(50 MG) BY MOUTH DAILY  . [DISCONTINUED] losartan (COZAAR) 50 MG tablet TAKE 1 TABLET(50 MG) BY MOUTH DAILY   No facility-administered encounter medications on file as of 08/25/2018.     No Known Allergies  Review of Systems  Constitutional: Negative for chills, fatigue, fever and unexpected weight change.  Eyes: Negative for photophobia and visual disturbance.  Respiratory: Negative for cough, chest tightness and shortness of breath.   Cardiovascular: Negative for chest pain,  palpitations and leg swelling.  Gastrointestinal: Negative for abdominal pain, constipation, diarrhea, nausea and vomiting.  Endocrine: Negative for polydipsia, polyphagia and polyuria.  Genitourinary: Negative for decreased urine volume and difficulty urinating.  Musculoskeletal: Negative for arthralgias and myalgias.  Neurological: Negative for dizziness, tremors, seizures, syncope, facial asymmetry, speech difficulty, weakness, light-headedness, numbness and headaches.  Psychiatric/Behavioral: Negative for confusion.  All other systems reviewed and are negative.       Objective:  BP 137/79   Pulse 80   Temp 97.8 F (36.6 C)   Ht 5' 4"  (1.626 m)   Wt 210 lb (95.3 kg)   BMI 36.05 kg/m    Wt Readings from Last 3  Encounters:  08/25/18 210 lb (95.3 kg)  12/26/17 201 lb 12.8 oz (91.5 kg)  05/23/17 206 lb 3.2 oz (93.5 kg)    Physical Exam  Results for orders placed or performed in visit on 12/26/17  [facility]  Result Value Ref Range   Glucose 94 65 - 99 mg/dL   BUN 18 8 - 27 mg/dL   Creatinine, Ser 0.72 0.57 - 1.00 mg/dL   GFR calc non Af Amer 89 >59 mL/min/1.73   GFR calc Af Amer 103 >59 mL/min/1.73   BUN/Creatinine Ratio 25 12 - 28   Sodium 140 134 - 144 mmol/L   Potassium 4.6 3.5 - 5.2 mmol/L   Chloride 101 96 - 106 mmol/L   CO2 25 20 - 29 mmol/L   Calcium 9.5 8.7 - 10.3 mg/dL  TSH  Result Value Ref Range   TSH 0.469 0.450 - 4.500 uIU/mL  Bayer DCA Hb A1c Waived  Result Value Ref Range   HB A1C (BAYER DCA - WAIVED) 5.4 <7.0 %       Pertinent labs & imaging results that were available during my care of the patient were reviewed by me and considered in my medical decision making.  Assessment & Plan:  Tammy Lawson was seen today for medical management of chronic issues.  Diagnoses and all orders for this visit:  Essential hypertension Diet and exercise encouraged. Labs pending. Continue medications as prescribed.  -     CMP14+EGFR -     CBC with Differential/Platelet -     Lipid panel -     TSH -     losartan (COZAAR) 50 MG tablet; TAKE 1 TABLET(50 MG) BY MOUTH DAILY  Prediabetes A1C 5.5 in office today.  Diet controlled. Diet and exercise encouraged.  -     CMP14+EGFR -     Bayer DCA Hb A1c Waived  BMI 36.0-36.9,adult Diet and exercise encouraged. Will recheck weight at next visit.  -     CMP14+EGFR -     Lipid panel -     TSH   Will schedule a CPE in the next three months.   Continue all other maintenance medications.  Follow up plan: Return in about 3 months (around 11/24/2018), or if symptoms worsen or fail to improve, for CPE, 6 mth HTN.  Educational handout given for DASH diet  The above assessment and management plan was discussed with the patient. The patient  verbalized understanding of and has agreed to the management plan. Patient is aware to call the clinic if symptoms persist or worsen. Patient is aware when to return to the clinic for a follow-up visit. Patient educated on when it is appropriate to go to the emergency department.   Monia Pouch, FNP-C [facility] [phone]

## 2018-08-25 NOTE — Patient Instructions (Signed)
DASH Eating Plan  DASH stands for "Dietary Approaches to Stop Hypertension." The DASH eating plan is a healthy eating plan that has been shown to reduce high blood pressure (hypertension). It may also reduce your risk for type 2 diabetes, heart disease, and stroke. The DASH eating plan may also help with weight loss.  What are tips for following this plan?    General guidelines   Avoid eating more than 2,300 mg (milligrams) of salt (sodium) a day. If you have hypertension, you may need to reduce your sodium intake to 1,500 mg a day.   Limit alcohol intake to no more than 1 drink a day for nonpregnant women and 2 drinks a day for men. One drink equals 12 oz of beer, 5 oz of wine, or 1 oz of hard liquor.   Work with your health care provider to maintain a healthy body weight or to lose weight. Ask what an ideal weight is for you.   Get at least 30 minutes of exercise that causes your heart to beat faster (aerobic exercise) most days of the week. Activities may include walking, swimming, or biking.   Work with your health care provider or diet and nutrition specialist (dietitian) to adjust your eating plan to your individual calorie needs.  Reading food labels     Check food labels for the amount of sodium per serving. Choose foods with less than 5 percent of the Daily Value of sodium. Generally, foods with less than 300 mg of sodium per serving fit into this eating plan.   To find whole grains, look for the word "whole" as the first word in the ingredient list.  Shopping   Buy products labeled as "low-sodium" or "no salt added."   Buy fresh foods. Avoid canned foods and premade or frozen meals.  Cooking   Avoid adding salt when cooking. Use salt-free seasonings or herbs instead of table salt or sea salt. Check with your health care provider or pharmacist before using salt substitutes.   Do not fry foods. Cook foods using healthy methods such as baking, boiling, grilling, and broiling instead.   Cook with  heart-healthy oils, such as olive, canola, soybean, or sunflower oil.  Meal planning   Eat a balanced diet that includes:  ? 5 or more servings of fruits and vegetables each day. At each meal, try to fill half of your plate with fruits and vegetables.  ? Up to 6-8 servings of whole grains each day.  ? Less than 6 oz of lean meat, poultry, or fish each day. A 3-oz serving of meat is about the same size as a deck of cards. One egg equals 1 oz.  ? 2 servings of low-fat dairy each day.  ? A serving of nuts, seeds, or beans 5 times each week.  ? Heart-healthy fats. Healthy fats called Omega-3 fatty acids are found in foods such as flaxseeds and coldwater fish, like sardines, salmon, and mackerel.   Limit how much you eat of the following:  ? Canned or prepackaged foods.  ? Food that is high in trans fat, such as fried foods.  ? Food that is high in saturated fat, such as fatty meat.  ? Sweets, desserts, sugary drinks, and other foods with added sugar.  ? Full-fat dairy products.   Do not salt foods before eating.   Try to eat at least 2 vegetarian meals each week.   Eat more home-cooked food and less restaurant, buffet, and fast food.     When eating at a restaurant, ask that your food be prepared with less salt or no salt, if possible.  What foods are recommended?  The items listed may not be a complete list. Talk with your dietitian about what dietary choices are best for you.  Grains  Whole-grain or whole-wheat bread. Whole-grain or whole-wheat pasta. Brown rice. Oatmeal. Quinoa. Bulgur. Whole-grain and low-sodium cereals. Pita bread. Low-fat, low-sodium crackers. Whole-wheat flour tortillas.  Vegetables  Fresh or frozen vegetables (raw, steamed, roasted, or grilled). Low-sodium or reduced-sodium tomato and vegetable juice. Low-sodium or reduced-sodium tomato sauce and tomato paste. Low-sodium or reduced-sodium canned vegetables.  Fruits  All fresh, dried, or frozen fruit. Canned fruit in natural juice (without  added sugar).  Meat and other protein foods  Skinless chicken or turkey. Ground chicken or turkey. Pork with fat trimmed off. Fish and seafood. Egg whites. Dried beans, peas, or lentils. Unsalted nuts, nut butters, and seeds. Unsalted canned beans. Lean cuts of beef with fat trimmed off. Low-sodium, lean deli meat.  Dairy  Low-fat (1%) or fat-free (skim) milk. Fat-free, low-fat, or reduced-fat cheeses. Nonfat, low-sodium ricotta or cottage cheese. Low-fat or nonfat yogurt. Low-fat, low-sodium cheese.  Fats and oils  Soft margarine without trans fats. Vegetable oil. Low-fat, reduced-fat, or light mayonnaise and salad dressings (reduced-sodium). Canola, safflower, olive, soybean, and sunflower oils. Avocado.  Seasoning and other foods  Herbs. Spices. Seasoning mixes without salt. Unsalted popcorn and pretzels. Fat-free sweets.  What foods are not recommended?  The items listed may not be a complete list. Talk with your dietitian about what dietary choices are best for you.  Grains  Baked goods made with fat, such as croissants, muffins, or some breads. Dry pasta or rice meal packs.  Vegetables  Creamed or fried vegetables. Vegetables in a cheese sauce. Regular canned vegetables (not low-sodium or reduced-sodium). Regular canned tomato sauce and paste (not low-sodium or reduced-sodium). Regular tomato and vegetable juice (not low-sodium or reduced-sodium). Pickles. Olives.  Fruits  Canned fruit in a light or heavy syrup. Fried fruit. Fruit in cream or butter sauce.  Meat and other protein foods  Fatty cuts of meat. Ribs. Fried meat. Bacon. Sausage. Bologna and other processed lunch meats. Salami. Fatback. Hotdogs. Bratwurst. Salted nuts and seeds. Canned beans with added salt. Canned or smoked fish. Whole eggs or egg yolks. Chicken or turkey with skin.  Dairy  Whole or 2% milk, cream, and half-and-half. Whole or full-fat cream cheese. Whole-fat or sweetened yogurt. Full-fat cheese. Nondairy creamers. Whipped toppings.  Processed cheese and cheese spreads.  Fats and oils  Butter. Stick margarine. Lard. Shortening. Ghee. Bacon fat. Tropical oils, such as coconut, palm kernel, or palm oil.  Seasoning and other foods  Salted popcorn and pretzels. Onion salt, garlic salt, seasoned salt, table salt, and sea salt. Worcestershire sauce. Tartar sauce. Barbecue sauce. Teriyaki sauce. Soy sauce, including reduced-sodium. Steak sauce. Canned and packaged gravies. Fish sauce. Oyster sauce. Cocktail sauce. Horseradish that you find on the shelf. Ketchup. Mustard. Meat flavorings and tenderizers. Bouillon cubes. Hot sauce and Tabasco sauce. Premade or packaged marinades. Premade or packaged taco seasonings. Relishes. Regular salad dressings.  Where to find more information:   National Heart, Lung, and Blood Institute: www.nhlbi.nih.gov   American Heart Association: www.heart.org  Summary   The DASH eating plan is a healthy eating plan that has been shown to reduce high blood pressure (hypertension). It may also reduce your risk for type 2 diabetes, heart disease, and stroke.   With the   DASH eating plan, you should limit salt (sodium) intake to 2,300 mg a day. If you have hypertension, you may need to reduce your sodium intake to 1,500 mg a day.   When on the DASH eating plan, aim to eat more fresh fruits and vegetables, whole grains, lean proteins, low-fat dairy, and heart-healthy fats.   Work with your health care provider or diet and nutrition specialist (dietitian) to adjust your eating plan to your individual calorie needs.  This information is not intended to replace advice given to you by your health care provider. Make sure you discuss any questions you have with your health care provider.  Document Released: 04/11/2011 Document Revised: 04/15/2016 Document Reviewed: 04/15/2016  [employer] Interactive Patient Education  2019 [employer]

## 2018-08-26 LAB — CMP14+EGFR
ALT: 15 IU/L (ref 0–32)
AST: 18 IU/L (ref 0–40)
Albumin/Globulin Ratio: 1.9 (ref 1.2–2.2)
Albumin: 4.5 g/dL (ref 3.8–4.8)
Alkaline Phosphatase: 91 IU/L (ref 39–117)
BUN/Creatinine Ratio: 21 (ref 12–28)
BUN: 16 mg/dL (ref 8–27)
Bilirubin Total: 0.4 mg/dL (ref 0.0–1.2)
CO2: 23 mmol/L (ref 20–29)
Calcium: 9.4 mg/dL (ref 8.7–10.3)
Chloride: 102 mmol/L (ref 96–106)
Creatinine, Ser: 0.77 mg/dL (ref 0.57–1.00)
GFR calc Af Amer: 95 mL/min/{1.73_m2} (ref >59)
GFR calc non Af Amer: 82 mL/min/{1.73_m2} (ref >59)
Globulin, Total: 2.4 g/dL (ref 1.5–4.5)
Glucose: 102 mg/dL — ABNORMAL HIGH (ref 65–99)
Potassium: 4.4 mmol/L (ref 3.5–5.2)
Sodium: 141 mmol/L (ref 134–144)
Total Protein: 6.9 g/dL (ref 6.0–8.5)

## 2018-08-26 LAB — LIPID PANEL
Chol/HDL Ratio: 4.9 ratio — ABNORMAL HIGH (ref 0.0–4.4)
Cholesterol, Total: 196 mg/dL (ref 100–199)
HDL: 40 mg/dL (ref >39)
LDL Calculated: 129 mg/dL — ABNORMAL HIGH (ref 0–99)
Triglycerides: 135 mg/dL (ref 0–149)
VLDL Cholesterol Cal: 27 mg/dL (ref 5–40)

## 2018-08-26 LAB — CBC WITH DIFFERENTIAL/PLATELET
Basophils Absolute: 0 10*3/uL (ref 0.0–0.2)
Basos: 1 %
EOS (ABSOLUTE): 0.1 10*3/uL (ref 0.0–0.4)
Eos: 2 %
Hematocrit: 40.7 % (ref 34.0–46.6)
Hemoglobin: 13.3 g/dL (ref 11.1–15.9)
Immature Grans (Abs): 0 10*3/uL (ref 0.0–0.1)
Immature Granulocytes: 0 %
Lymphocytes Absolute: 1.4 10*3/uL (ref 0.7–3.1)
Lymphs: 22 %
MCH: 29.6 pg (ref 26.6–33.0)
MCHC: 32.7 g/dL (ref 31.5–35.7)
MCV: 90 fL (ref 79–97)
Monocytes Absolute: 0.4 10*3/uL (ref 0.1–0.9)
Monocytes: 6 %
Neutrophils Absolute: 4.3 10*3/uL (ref 1.4–7.0)
Neutrophils: 69 %
Platelets: 225 10*3/uL (ref 150–450)
RBC: 4.5 x10E6/uL (ref 3.77–5.28)
RDW: 13.1 % (ref 11.7–15.4)
WBC: 6.2 10*3/uL (ref 3.4–10.8)

## 2018-08-26 LAB — TSH: TSH: 0.785 u[IU]/mL (ref 0.450–4.500)

## 2018-08-28 ENCOUNTER — Ambulatory Visit: Payer: BLUE CROSS/BLUE SHIELD | Admitting: Family Medicine

## 2018-11-12 ENCOUNTER — Encounter: Payer: Self-pay | Admitting: *Deleted

## 2018-12-15 ENCOUNTER — Encounter: Payer: BLUE CROSS/BLUE SHIELD | Admitting: Family Medicine

## 2018-12-16 ENCOUNTER — Encounter: Payer: BLUE CROSS/BLUE SHIELD | Admitting: Family Medicine

## 2019-01-07 ENCOUNTER — Encounter: Payer: Self-pay | Admitting: *Deleted

## 2019-01-19 ENCOUNTER — Other Ambulatory Visit: Payer: Self-pay

## 2019-01-20 ENCOUNTER — Ambulatory Visit (INDEPENDENT_AMBULATORY_CARE_PROVIDER_SITE_OTHER): Payer: BC Managed Care – PPO | Admitting: Family Medicine

## 2019-01-20 ENCOUNTER — Encounter: Payer: Self-pay | Admitting: Family Medicine

## 2019-01-20 VITALS — BP 128/76 | HR 77 | Temp 97.5°F | Resp 18 | Ht 64.0 in | Wt 210.0 lb

## 2019-01-20 DIAGNOSIS — Z13220 Encounter for screening for lipoid disorders: Secondary | ICD-10-CM | POA: Diagnosis not present

## 2019-01-20 DIAGNOSIS — Z130 Encounter for screening for diseases of the blood and blood-forming organs and certain disorders involving the immune mechanism: Secondary | ICD-10-CM

## 2019-01-20 DIAGNOSIS — I10 Essential (primary) hypertension: Secondary | ICD-10-CM

## 2019-01-20 DIAGNOSIS — Z13228 Encounter for screening for other metabolic disorders: Secondary | ICD-10-CM | POA: Diagnosis not present

## 2019-01-20 DIAGNOSIS — E559 Vitamin D deficiency, unspecified: Secondary | ICD-10-CM | POA: Diagnosis not present

## 2019-01-20 DIAGNOSIS — E7800 Pure hypercholesterolemia, unspecified: Secondary | ICD-10-CM

## 2019-01-20 DIAGNOSIS — Z780 Asymptomatic menopausal state: Secondary | ICD-10-CM

## 2019-01-20 DIAGNOSIS — Z0001 Encounter for general adult medical examination with abnormal findings: Secondary | ICD-10-CM | POA: Diagnosis not present

## 2019-01-20 DIAGNOSIS — Z6836 Body mass index (BMI) 36.0-36.9, adult: Secondary | ICD-10-CM

## 2019-01-20 DIAGNOSIS — Z23 Encounter for immunization: Secondary | ICD-10-CM | POA: Diagnosis not present

## 2019-01-20 DIAGNOSIS — Z1329 Encounter for screening for other suspected endocrine disorder: Secondary | ICD-10-CM

## 2019-01-20 DIAGNOSIS — Z1231 Encounter for screening mammogram for malignant neoplasm of breast: Secondary | ICD-10-CM | POA: Diagnosis not present

## 2019-01-20 DIAGNOSIS — Z0000 Encounter for general adult medical examination without abnormal findings: Secondary | ICD-10-CM | POA: Diagnosis not present

## 2019-01-20 DIAGNOSIS — Z13820 Encounter for screening for osteoporosis: Secondary | ICD-10-CM

## 2019-01-20 DIAGNOSIS — I4510 Unspecified right bundle-branch block: Secondary | ICD-10-CM

## 2019-01-20 LAB — URINALYSIS, COMPLETE
Bilirubin, UA: NEGATIVE
Glucose, UA: NEGATIVE
Ketones, UA: NEGATIVE
Nitrite, UA: NEGATIVE
Protein,UA: NEGATIVE
Specific Gravity, UA: 1.015 (ref 1.005–1.030)
Urobilinogen, Ur: 0.2 mg/dL (ref 0.2–1.0)
pH, UA: 6.5 (ref 5.0–7.5)

## 2019-01-20 LAB — MICROSCOPIC EXAMINATION: Renal Epithel, UA: NONE SEEN /hpf

## 2019-01-20 MED ORDER — LOSARTAN POTASSIUM 50 MG PO TABS: ORAL_TABLET | ORAL | 1 refills | Status: DC

## 2019-01-20 NOTE — Patient Instructions (Addendum)
  Health Maintenance, Female Adopting a healthy lifestyle and getting preventive care are important in promoting health and wellness. Ask your health care provider about:  The right schedule for you to have regular tests and exams.  Things you can do on your own to prevent diseases and keep yourself healthy. What should I know about diet, weight, and exercise? Eat a healthy diet   Eat a diet that includes plenty of vegetables, fruits, low-fat dairy products, and lean protein.  Do not eat a lot of foods that are high in solid fats, added sugars, or sodium. Maintain a healthy weight Body mass index (BMI) is used to identify weight problems. It estimates body fat based on height and weight. Your health care provider can help determine your BMI and help you achieve or maintain a healthy weight. Get regular exercise Get regular exercise. This is one of the most important things you can do for your health. Most adults should:  Exercise for at least 150 minutes each week. The exercise should increase your heart rate and make you sweat (moderate-intensity exercise).  Do strengthening exercises at least twice a week. This is in addition to the moderate-intensity exercise.  Spend less time sitting. Even light physical activity can be beneficial. Watch cholesterol and blood lipids Have your blood tested for lipids and cholesterol at 64 years of age, then have this test every 5 years. Have your cholesterol levels checked more often if:  Your lipid or cholesterol levels are high.  You are older than 64 years of age.  You are at high risk for heart disease. What should I know about cancer screening? Depending on your health history and family history, you may need to have cancer screening at various ages. This may include screening for:  Breast cancer.  Cervical cancer.  Colorectal cancer.  Skin cancer.  Lung cancer. What should I know about heart disease, diabetes, and high blood  pressure? Blood pressure and heart disease  High blood pressure causes heart disease and increases the risk of stroke. This is more likely to develop in people who have high blood pressure readings, are of African descent, or are overweight.  Have your blood pressure checked: ? Every 3-5 years if you are 18-39 years of age. ? Every year if you are 40 years old or older. Diabetes Have regular diabetes screenings. This checks your fasting blood sugar level. Have the screening done:  Once every three years after age 40 if you are at a normal weight and have a low risk for diabetes.  More often and at a younger age if you are overweight or have a high risk for diabetes. What should I know about preventing infection? Hepatitis B If you have a higher risk for hepatitis B, you should be screened for this virus. Talk with your health care provider to find out if you are at risk for hepatitis B infection. Hepatitis C Testing is recommended for:  Everyone born from 1945 through 1965.  Anyone with known risk factors for hepatitis C. Sexually transmitted infections (STIs)  Get screened for STIs, including gonorrhea and chlamydia, if: ? You are sexually active and are younger than 64 years of age. ? You are older than 64 years of age and your health care provider tells you that you are at risk for this type of infection. ? Your sexual activity has changed since you were last screened, and you are at increased risk for chlamydia or gonorrhea. Ask your health care   if you are at risk.  Ask your health care provider about whether you are at high risk for HIV. Your health care provider may recommend a prescription medicine to help prevent HIV infection. If you choose to take medicine to prevent HIV, you should first get tested for HIV. You should then be tested every 3 months for as long as you are taking the medicine. Pregnancy  If you are about to stop having your period (premenopausal) and  you may become pregnant, seek counseling before you get pregnant.  Take 400 to 800 micrograms (mcg) of folic acid every day if you become pregnant.  Ask for birth control (contraception) if you want to prevent pregnancy. Osteoporosis and menopause Osteoporosis is a disease in which the bones lose minerals and strength with aging. This can result in bone fractures. If you are 8 years old or older, or if you are at risk for osteoporosis and fractures, ask your health care provider if you should:  Be screened for bone loss.  Take a calcium or vitamin D supplement to lower your risk of fractures.  Be given hormone replacement therapy (HRT) to treat symptoms of menopause. Follow these instructions at home: Lifestyle  Do not use any products that contain nicotine or tobacco, such as cigarettes, e-cigarettes, and chewing tobacco. If you need help quitting, ask your health care provider.  Do not use street drugs.  Do not share needles.  Ask your health care provider for help if you need support or information about quitting drugs. Alcohol use  Do not drink alcohol if: ? Your health care provider tells you not to drink. ? You are pregnant, may be pregnant, or are planning to become pregnant.  If you drink alcohol: ? Limit how much you use to 0-1 drink a day. ? Limit intake if you are breastfeeding.  Be aware of how much alcohol is in your drink. In the [place] one drink equals one 12 oz bottle of beer (355 mL), one 5 oz glass of wine (148 mL), or one 1 oz glass of hard liquor (44 mL). General instructions  Schedule regular health, dental, and eye exams.  Stay current with your vaccines.  Tell your health care provider if: ? You often feel depressed. ? You have ever been abused or do not feel safe at home. Summary  Adopting a healthy lifestyle and getting preventive care are important in promoting health and wellness.  Follow your health care provider's instructions about healthy  diet, exercising, and getting tested or screened for diseases.  Follow your health care provider's instructions on monitoring your cholesterol and blood pressure. This information is not intended to replace advice given to you by your health care provider. Make sure you discuss any questions you have with your health care provider. Document Released: 11/05/2010 Document Revised: 04/15/2018 Document Reviewed: 04/15/2018 Elsevier Patient Education  2020 [place] DASH stands for "Dietary Approaches to Stop Hypertension." The DASH eating plan is a healthy eating plan that has been shown to reduce high blood pressure (hypertension). Additional health benefits may include reducing the risk of type 2 diabetes mellitus, heart disease, and stroke. The DASH eating plan may also help with weight loss.  WHAT DO I NEED TO KNOW ABOUT THE DASH EATING PLAN? For the DASH eating plan, you will follow these general guidelines:  Choose foods with a percent daily value for sodium of less than 5% (as listed on the food label).  Use salt-free  seasonings or herbs instead of table salt or sea salt.  Check with your health care provider or pharmacist before using salt substitutes.  Eat lower-sodium products, often labeled as "lower sodium" or "no salt added."  Eat fresh foods.  Eat more vegetables, fruits, and low-fat dairy products.  Choose whole grains. Look for the word "whole" as the first word in the ingredient list.  Choose fish and skinless chicken or [place] more often than red meat. Limit fish, poultry, and meat to 6 oz (170 g) each day.  Limit sweets, desserts, sugars, and sugary drinks.  Choose heart-healthy fats.  Limit cheese to 1 oz (28 g) per day.  Eat more home-cooked food and less restaurant, buffet, and fast food.  Limit fried foods.  Cook foods using methods other than frying.  Limit canned vegetables. If you do use them, rinse them well to decrease the  sodium.  When eating at a restaurant, ask that your food be prepared with less salt, or no salt if possible.  WHAT FOODS CAN I EAT? Seek help from a dietitian for individual calorie needs.  Grains Whole grain or whole wheat bread. Brown rice. Whole grain or whole wheat pasta. Quinoa, bulgur, and whole grain cereals. Low-sodium cereals. Corn or whole wheat flour tortillas. Whole grain cornbread. Whole grain crackers. Low-sodium crackers.  Vegetables Fresh or frozen vegetables (raw, steamed, roasted, or grilled). Low-sodium or reduced-sodium tomato and vegetable juices. Low-sodium or reduced-sodium tomato sauce and paste. Low-sodium or reduced-sodium canned vegetables.   Fruits All fresh, canned (in natural juice), or frozen fruits.  Meat and Other Protein Products Ground beef (85% or leaner), grass-fed beef, or beef trimmed of fat. Skinless chicken or [place]. Ground chicken or [place]. Pork trimmed of fat. All fish and seafood. Eggs. Dried beans, peas, or lentils. Unsalted nuts and seeds. Unsalted canned beans.  Dairy Low-fat dairy products, such as skim or 1% milk, 2% or reduced-fat cheeses, low-fat ricotta or cottage cheese, or plain low-fat yogurt. Low-sodium or reduced-sodium cheeses.  Fats and Oils Tub margarines without trans fats. Light or reduced-fat mayonnaise and salad dressings (reduced sodium). Avocado. Safflower, olive, or canola oils. Natural peanut or almond butter.  Other Unsalted popcorn and pretzels. The items listed above may not be a complete list of recommended foods or beverages. Contact your dietitian for more options.  WHAT FOODS ARE NOT RECOMMENDED?  Grains White bread. White pasta. White rice. Refined cornbread. Bagels and croissants. Crackers that contain trans fat.  Vegetables Creamed or fried vegetables. Vegetables in a cheese sauce. Regular canned vegetables. Regular canned tomato sauce and paste. Regular tomato and vegetable juices.  Fruits Dried  fruits. Canned fruit in light or heavy syrup. Fruit juice.  Meat and Other Protein Products Fatty cuts of meat. Ribs, chicken wings, bacon, sausage, bologna, salami, chitterlings, fatback, hot dogs, bratwurst, and packaged luncheon meats. Salted nuts and seeds. Canned beans with salt.  Dairy Whole or 2% milk, cream, half-and-half, and cream cheese. Whole-fat or sweetened yogurt. Full-fat cheeses or blue cheese. Nondairy creamers and whipped toppings. Processed cheese, cheese spreads, or cheese curds.  Condiments Onion and garlic salt, seasoned salt, table salt, and sea salt. Canned and packaged gravies. Worcestershire sauce. Tartar sauce. Barbecue sauce. Teriyaki sauce. Soy sauce, including reduced sodium. Steak sauce. Fish sauce. Oyster sauce. Cocktail sauce. Horseradish. Ketchup and mustard. Meat flavorings and tenderizers. Bouillon cubes. Hot sauce. Tabasco sauce. Marinades. Taco seasonings. Relishes.  Fats and Oils Butter, stick margarine, lard, shortening, ghee, and bacon fat. Coconut, palm  kernel, or palm oils. Regular salad dressings.  Other Pickles and olives. Salted popcorn and pretzels.  The items listed above may not be a complete list of foods and beverages to avoid. Contact your dietitian for more information.  WHERE CAN I FIND MORE INFORMATION? [employer], [employer], [employer]: travelstabloid.com Document Released: 04/11/2011 Document Revised: 09/06/2013 Document Reviewed: 02/24/2013 [facility] Patient Information 2015 [place], [place]. This information is not intended to replace advice given to you by your health care provider. Make sure you discuss any questions you have with your health care provider.   I think that you would greatly benefit from seeing a nutritionist.  If you are interested, please call Dr Jenne Campus at [phone] to schedule an appointment.

## 2019-01-20 NOTE — Progress Notes (Signed)
Subjective:  Patient ID: Tammy Lawson, female    DOB: 13-Feb-1956, 63 y.o.   MRN: [id number]  Patient Care Team: Baruch Gouty, FNP as PCP - General (Family Medicine)   Chief Complaint:  Annual Exam (CPE ) and Hypertension   HPI: Tammy Lawson is a 63 y.o. female presenting on 01/20/2019 for Annual Exam (CPE ) and Hypertension   Pt presents today for annual physical exam. States she is doing well. She has been the primary care provider for her husband who has had a recent change in health status. States this has caused her to put her healthcare on hold. States she has never had a colonoscopy. States her father had CRC. She denies rectal bleeding, weight loss, fatigue, fever, chills, or night sweats. She is due for her mammogram, states this is scheduled for today after office visit. Due for DEXA, will place order today. She does not exercise on a regular basis or watch her diet. Has had vitamin D deficiency in the past, not on repletion therapy. States she has been taking her blood pressure medications as prescribed and her blood pressures have been well controlled at home, running 115/70.   Hypertension This is a chronic problem. The current episode started more than 1 year ago. The problem is controlled. Pertinent negatives include no anxiety, blurred vision, chest pain, headaches, malaise/fatigue, neck pain, orthopnea, palpitations, peripheral edema, PND, shortness of breath or sweats. There are no associated agents to hypertension. Risk factors for coronary artery disease include obesity, sedentary lifestyle, stress and family history. Past treatments include angiotensin blockers. The current treatment provides significant improvement. Compliance problems include exercise and diet.  There is no history of angina, kidney disease, CAD/MI, CVA, heart failure, left ventricular hypertrophy, PVD or retinopathy.     Relevant past medical, surgical, family, and social history reviewed and updated  as indicated.  Allergies and medications reviewed and updated. Date reviewed: Chart in Epic.   History reviewed. No pertinent past medical history.  History reviewed. No pertinent surgical history.  Social History   Socioeconomic History  . Marital status: Married    Spouse name: Not on file  . Number of children: Not on file  . Years of education: Not on file  . Highest education level: Not on file  Occupational History  . Not on file  Social Needs  . Financial resource strain: Not on file  . Food insecurity    Worry: Not on file    Inability: Not on file  . Transportation needs    Medical: Not on file    Non-medical: Not on file  Tobacco Use  . Smoking status: Never Smoker  . Smokeless tobacco: Never Used  Substance and Sexual Activity  . Alcohol use: No  . Drug use: No  . Sexual activity: Not on file  Lifestyle  . Physical activity    Days per week: Not on file    Minutes per session: Not on file  . Stress: Not on file  Relationships  . Social Herbalist on phone: Not on file    Gets together: Not on file    Attends religious service: Not on file    Active member of club or organization: Not on file    Attends meetings of clubs or organizations: Not on file    Relationship status: Not on file  . Intimate partner violence    Fear of current or ex partner: Not on file  Emotionally abused: Not on file    Physically abused: Not on file    Forced sexual activity: Not on file  Other Topics Concern  . Not on file  Social History Narrative  . Not on file    Outpatient Encounter Medications as of 01/20/2019  Medication Sig  . losartan (COZAAR) 50 MG tablet TAKE 1 TABLET(50 MG) BY MOUTH DAILY  . [DISCONTINUED] losartan (COZAAR) 50 MG tablet TAKE 1 TABLET(50 MG) BY MOUTH DAILY   No facility-administered encounter medications on file as of 01/20/2019.     Allergies  Allergen Reactions  . Other     Ranch dressing  - GI     Review of Systems   Constitutional: Negative for activity change, appetite change, chills, diaphoresis, fatigue, fever, malaise/fatigue and unexpected weight change.  HENT: Negative.   Eyes: Negative.  Negative for blurred vision, photophobia and visual disturbance.  Respiratory: Negative for cough, choking, chest tightness, shortness of breath, wheezing and stridor.   Cardiovascular: Negative for chest pain, palpitations, orthopnea, leg swelling and PND.  Gastrointestinal: Negative for abdominal distention, abdominal pain, anal bleeding, blood in stool, constipation, diarrhea, nausea, rectal pain and vomiting.  Endocrine: Negative.  Negative for cold intolerance, heat intolerance, polydipsia, polyphagia and polyuria.  Genitourinary: Negative for decreased urine volume, difficulty urinating, dysuria, frequency, hematuria and urgency.  Musculoskeletal: Negative for arthralgias, myalgias and neck pain.  Skin: Negative.  Negative for color change and pallor.  Allergic/Immunologic: Negative.   Neurological: Negative for dizziness and headaches.  Hematological: Negative.  Negative for adenopathy. Does not bruise/bleed easily.  Psychiatric/Behavioral: Negative for confusion, hallucinations, self-injury, sleep disturbance and suicidal ideas. The patient is not nervous/anxious.   All other systems reviewed and are negative.       Objective:  BP 128/76 (BP Location: Right Arm, Cuff Size: Normal)   Pulse 77   Temp (!) 97.5 F (36.4 C)   Resp 18   Ht _0  (1.626 m)   Wt 210 lb (95.3 kg)   SpO2 100%   BMI 36.05 kg/m    Wt Readings from Last 3 Encounters:  01/20/19 210 lb (95.3 kg)  08/25/18 210 lb (95.3 kg)  12/26/17 201 lb 12.8 oz (91.5 kg)    Physical Exam Vitals signs and nursing note reviewed.  Constitutional:      General: She is not in acute distress.    Appearance: Normal appearance. She is well-developed and well-groomed. She is obese. She is not ill-appearing, toxic-appearing or diaphoretic.   HENT:     Head: Normocephalic and atraumatic.     Jaw: There is normal jaw occlusion.     Right Ear: Hearing, tympanic membrane, ear canal and external ear normal.     Left Ear: Hearing, tympanic membrane, ear canal and external ear normal.     Nose: Nose normal.     Mouth/Throat:     Lips: Pink.     Mouth: Mucous membranes are moist.     Pharynx: Oropharynx is clear. Uvula midline.  Eyes:     General: Lids are normal.     Extraocular Movements: Extraocular movements intact.     Conjunctiva/sclera: Conjunctivae normal.     Pupils: Pupils are equal, round, and reactive to light.  Neck:     Musculoskeletal: Normal range of motion and neck supple.     Thyroid: No thyroid mass, thyromegaly or thyroid tenderness.     Vascular: No carotid bruit or JVD.     Trachea: Trachea and phonation normal.  Cardiovascular:  Rate and Rhythm: Normal rate and regular rhythm.     Chest Wall: PMI is not displaced.     Pulses: Normal pulses.     Heart sounds: Normal heart sounds. No murmur. No friction rub. No gallop.   Pulmonary:     Effort: Pulmonary effort is normal. No respiratory distress.     Breath sounds: Normal breath sounds. No wheezing.  Chest:     Chest wall: No mass, lacerations, deformity, swelling, tenderness, crepitus or edema. There is no dullness to percussion.     Breasts: Breasts are symmetrical.        Right: Normal.        Left: Normal.  Abdominal:     General: Abdomen is protuberant. Bowel sounds are normal. There is no distension or abdominal bruit.     Palpations: Abdomen is soft. There is no hepatomegaly, splenomegaly, mass or pulsatile mass.     Tenderness: There is no abdominal tenderness. There is no right CVA tenderness or left CVA tenderness.     Hernia: No hernia is present.  Genitourinary:    Comments: Deferred, PAP due 2021 Musculoskeletal: Normal range of motion.     Right lower leg: No edema.     Left lower leg: No edema.  Lymphadenopathy:     Cervical: No  cervical adenopathy.     Upper Body:     Right upper body: No supraclavicular, axillary or pectoral adenopathy.     Left upper body: No supraclavicular, axillary or pectoral adenopathy.  Skin:    General: Skin is warm and dry.     Capillary Refill: Capillary refill takes less than 2 seconds.     Coloration: Skin is not cyanotic, jaundiced or pale.     Findings: No rash.  Neurological:     General: No focal deficit present.     Mental Status: She is alert and oriented to person, place, and time.     Cranial Nerves: Cranial nerves are intact.     Sensory: Sensation is intact.     Motor: Motor function is intact.     Coordination: Coordination is intact.     Gait: Gait is intact.     Deep Tendon Reflexes: Reflexes are normal and symmetric.  Psychiatric:        Attention and Perception: Attention and perception normal.        Mood and Affect: Mood and affect normal.        Speech: Speech normal.        Behavior: Behavior normal. Behavior is cooperative.        Thought Content: Thought content normal.        Cognition and Memory: Cognition and memory normal.        Judgment: Judgment normal.     Results for orders placed or performed in visit on 01/20/19  Microscopic Examination   URINE  Result Value Ref Range   WBC, UA 6-10 (A) 0 - 5 /hpf   RBC 3-10 (A) 0 - 2 /hpf   Epithelial Cells (non renal) 0-10 0 - 10 /hpf   Renal Epithel, UA None seen None seen /hpf   Bacteria, UA Few None seen/Few  Urinalysis, Complete  Result Value Ref Range   Specific Gravity, UA 1.015 1.005 - 1.030   pH, UA 6.5 5.0 - 7.5   Color, UA Yellow Yellow   Appearance Ur Clear Clear   Leukocytes,UA 1+ (A) Negative   Protein,UA Negative Negative/Trace   Glucose, UA Negative Negative  Ketones, UA Negative Negative   RBC, UA Trace (A) Negative   Bilirubin, UA Negative Negative   Urobilinogen, Ur 0.2 0.2 - 1.0 mg/dL   Nitrite, UA Negative Negative   Microscopic Examination See below:      EKG: RBBB, no  change from previous EKG. No acute changes. Monia Pouch, FNP-C.   Pertinent labs & imaging results that were available during my care of the patient were reviewed by me and considered in my medical decision making.  Assessment & Plan:  Luci was seen today for annual exam and hypertension.  Diagnoses and all orders for this visit:  Routine general medical examination at a health care facility Declines referral for colonoscopy, states she will consider this. Pt aware of the importance of this procedure due to first degree relative history of CRC. Pt aware of importance of screening, will make decision and let provider know. Health maintenance reviewed and updated today. Labs pending. Mammogram today. DEXA ordered.  -     DEXAScan; Future -     EKG 12-Lead -     CMP14+EGFR -     CBC with Differential/Platelet -     Microalbumin / creatinine urine ratio -     VITAMIN D 25 Hydroxy (Vit-D Deficiency, Fractures) -     Lipid panel -     Thyroid Panel With TSH -     Urinalysis, Complete -     Tdap vaccine greater than or equal to 7yo IM -     Microscopic Examination  Essential hypertension BP well controlled at home. Changes were not made in regimen today. Daily blood pressure log given with instructions on how to fill out and told to bring to next visit. Goal BP 130/80. Pt aware to report any persistent high or low readings. DASH diet and exercise encouraged. Exercise at least 150 minutes per week and increase as tolerated. Goal BMI > 25. Stress management encouraged. Smoking cessation discussed. Avoid excessive alcohol. Avoid NSAID's. Avoid more than 2000 mg of sodium daily. Medications as prescribed. Follow up as scheduled.  -     EKG 12-Lead -     CMP14+EGFR -     CBC with Differential/Platelet -     Microalbumin / creatinine urine ratio -     Urinalysis, Complete -     losartan (COZAAR) 50 MG tablet; TAKE 1 TABLET(50 MG) BY MOUTH DAILY  Screening for deficiency anemia -     CBC with  Differential/Platelet  Screening for lipoid disorders -     Lipid panel  Screening for endocrine, metabolic and immunity disorder -     CMP14+EGFR -     Thyroid Panel With TSH  Need for vaccine for DT (diphtheria-tetanus) -     Tdap vaccine greater than or equal to 7yo IM  Encounter for osteoporosis screening in asymptomatic postmenopausal patient -     DEXAScan; Future  Vitamin D deficiency Labs pending. Continue repletion therapy. If indicated, will initiate repletion dosage. Eat foods rich in Vit D including milk, orange juice, yogurt with vitamin D added, salmon or mackerel, canned tuna fish, cereals with vitamin D added, and cod liver oil. Get out in the sun but make sure to wear at least SPF 30 sunscreen.  -     VITAMIN D 25 Hydroxy (Vit-D Deficiency, Fractures)  BMI 36.0-36.9,adult Diet and exercise encouraged. Labs pending.  -     CMP14+EGFR -     CBC with Differential/Platelet -     Lipid panel -  Thyroid Panel With TSH  RBBB Noted on previous EKG. No associated symptoms. No acute changes on EKG today.     Continue all other maintenance medications.  Follow up plan: Return in 6 months (on 07/20/2019), or if symptoms worsen or fail to improve, for HTN.  Continue healthy lifestyle choices, including diet (rich in fruits, vegetables, and lean proteins, and low in salt and simple carbohydrates) and exercise (at least 30 minutes of moderate physical activity daily).  Educational handout given for health maintenance, DASH diet  The above assessment and management plan was discussed with the patient. The patient verbalized understanding of and has agreed to the management plan. Patient is aware to call the clinic if they develop any new symptoms or if symptoms persist or worsen. Patient is aware when to return to the clinic for a follow-up visit. Patient educated on when it is appropriate to go to the emergency department.   Monia Pouch, FNP-C [facility] [phone]

## 2019-01-21 LAB — CMP14+EGFR
ALT: 18 IU/L (ref 0–32)
AST: 21 IU/L (ref 0–40)
Albumin/Globulin Ratio: 2.3 — ABNORMAL HIGH (ref 1.2–2.2)
Albumin: 4.9 g/dL — ABNORMAL HIGH (ref 3.8–4.8)
Alkaline Phosphatase: 86 IU/L (ref 39–117)
BUN/Creatinine Ratio: 19 (ref 12–28)
BUN: 14 mg/dL (ref 8–27)
Bilirubin Total: 0.6 mg/dL (ref 0.0–1.2)
CO2: 23 mmol/L (ref 20–29)
Calcium: 9.7 mg/dL (ref 8.7–10.3)
Chloride: 104 mmol/L (ref 96–106)
Creatinine, Ser: 0.73 mg/dL (ref 0.57–1.00)
GFR calc Af Amer: 101 mL/min/{1.73_m2} (ref >59)
GFR calc non Af Amer: 87 mL/min/{1.73_m2} (ref >59)
Globulin, Total: 2.1 g/dL (ref 1.5–4.5)
Glucose: 102 mg/dL — ABNORMAL HIGH (ref 65–99)
Potassium: 4.3 mmol/L (ref 3.5–5.2)
Sodium: 143 mmol/L (ref 134–144)
Total Protein: 7 g/dL (ref 6.0–8.5)

## 2019-01-21 LAB — CBC WITH DIFFERENTIAL/PLATELET
Basophils Absolute: 0.1 10*3/uL (ref 0.0–0.2)
Basos: 1 %
EOS (ABSOLUTE): 0.1 10*3/uL (ref 0.0–0.4)
Eos: 2 %
Hematocrit: 42.3 % (ref 34.0–46.6)
Hemoglobin: 13.7 g/dL (ref 11.1–15.9)
Immature Grans (Abs): 0 10*3/uL (ref 0.0–0.1)
Immature Granulocytes: 0 %
Lymphocytes Absolute: 1.3 10*3/uL (ref 0.7–3.1)
Lymphs: 21 %
MCH: 29.5 pg (ref 26.6–33.0)
MCHC: 32.4 g/dL (ref 31.5–35.7)
MCV: 91 fL (ref 79–97)
Monocytes Absolute: 0.4 10*3/uL (ref 0.1–0.9)
Monocytes: 6 %
Neutrophils Absolute: 4.2 10*3/uL (ref 1.4–7.0)
Neutrophils: 70 %
Platelets: 240 10*3/uL (ref 150–450)
RBC: 4.64 x10E6/uL (ref 3.77–5.28)
RDW: 13.3 % (ref 11.7–15.4)
WBC: 6 10*3/uL (ref 3.4–10.8)

## 2019-01-21 LAB — VITAMIN D 25 HYDROXY (VIT D DEFICIENCY, FRACTURES): Vit D, 25-Hydroxy: 12.5 ng/mL — ABNORMAL LOW (ref 30.0–100.0)

## 2019-01-21 LAB — MICROALBUMIN / CREATININE URINE RATIO
Creatinine, Urine: 43.1 mg/dL
Microalb/Creat Ratio: 8 mg/g creat (ref 0–29)
Microalbumin, Urine: 3.4 ug/mL

## 2019-01-21 LAB — THYROID PANEL WITH TSH
Free Thyroxine Index: 2.2 (ref 1.2–4.9)
T3 Uptake Ratio: 25 % (ref 24–39)
T4, Total: 8.9 ug/dL (ref 4.5–12.0)
TSH: 0.565 u[IU]/mL (ref 0.450–4.500)

## 2019-01-21 LAB — LIPID PANEL
Chol/HDL Ratio: 5.6 ratio — ABNORMAL HIGH (ref 0.0–4.4)
Cholesterol, Total: 211 mg/dL — ABNORMAL HIGH (ref 100–199)
HDL: 38 mg/dL — ABNORMAL LOW (ref >39)
LDL Chol Calc (NIH): 147 mg/dL — ABNORMAL HIGH (ref 0–99)
Triglycerides: 144 mg/dL (ref 0–149)
VLDL Cholesterol Cal: 26 mg/dL (ref 5–40)

## 2019-01-21 MED ORDER — ROSUVASTATIN CALCIUM 10 MG PO TABS: 10.0000 mg | ORAL_TABLET | Freq: Every day | ORAL | 1 refills | Status: DC

## 2019-01-21 MED ORDER — VITAMIN D (ERGOCALCIFEROL) 1.25 MG (50000 UNIT) PO CAPS: 50000.0000 [IU] | ORAL_CAPSULE | ORAL | 1 refills | Status: DC

## 2019-01-21 NOTE — Addendum Note (Signed)
Addended by: Baruch Gouty on: 01/21/2019 07:56 AM   Modules accepted: Orders

## 2019-01-25 ENCOUNTER — Telehealth: Payer: Self-pay | Admitting: Family Medicine

## 2019-01-25 ENCOUNTER — Other Ambulatory Visit: Payer: Self-pay | Admitting: Family Medicine

## 2019-01-25 ENCOUNTER — Other Ambulatory Visit: Payer: Self-pay | Admitting: *Deleted

## 2019-01-25 DIAGNOSIS — E7800 Pure hypercholesterolemia, unspecified: Secondary | ICD-10-CM | POA: Insufficient documentation

## 2019-01-25 DIAGNOSIS — R718 Other abnormality of red blood cells: Secondary | ICD-10-CM

## 2019-01-25 DIAGNOSIS — R8290 Unspecified abnormal findings in urine: Secondary | ICD-10-CM

## 2019-01-25 MED ORDER — PRAVASTATIN SODIUM 20 MG PO TABS: 20.0000 mg | ORAL_TABLET | Freq: Every day | ORAL | 3 refills | Status: DC

## 2019-01-25 NOTE — Telephone Encounter (Signed)
We can change the statin to pravastatin to see if this makes a difference.

## 2019-01-25 NOTE — Telephone Encounter (Signed)
Pt aware of pravastatin.

## 2019-01-25 NOTE — Telephone Encounter (Signed)
Called pt - aware of mychart and she HAD reviewed it there. She also printed a copy. Several labs were discussed, Urine future order placed for POS RBC for next week.   Rakes: Pt says that Crestor makes her feel nervous and "like she is getting ready to speak before a crowd"   Could the dose be lowered OR change to something else?  We did discuss her cholesterol #s and the need for this med.

## 2019-02-04 ENCOUNTER — Other Ambulatory Visit: Payer: BC Managed Care – PPO

## 2019-02-04 ENCOUNTER — Other Ambulatory Visit: Payer: Self-pay

## 2019-02-04 DIAGNOSIS — R718 Other abnormality of red blood cells: Secondary | ICD-10-CM

## 2019-02-04 DIAGNOSIS — R8290 Unspecified abnormal findings in urine: Secondary | ICD-10-CM

## 2019-02-04 LAB — URINALYSIS, COMPLETE
Bilirubin, UA: NEGATIVE
Glucose, UA: NEGATIVE
Ketones, UA: NEGATIVE
Nitrite, UA: NEGATIVE
Protein,UA: NEGATIVE
Specific Gravity, UA: 1.025 (ref 1.005–1.030)
Urobilinogen, Ur: 0.2 mg/dL (ref 0.2–1.0)
pH, UA: 6 (ref 5.0–7.5)

## 2019-02-04 LAB — MICROSCOPIC EXAMINATION

## 2019-02-19 ENCOUNTER — Other Ambulatory Visit: Payer: Self-pay | Admitting: Family Medicine

## 2019-02-19 DIAGNOSIS — I10 Essential (primary) hypertension: Secondary | ICD-10-CM

## 2019-02-24 ENCOUNTER — Ambulatory Visit: Payer: BLUE CROSS/BLUE SHIELD | Admitting: Family Medicine

## 2019-02-25 ENCOUNTER — Ambulatory Visit: Payer: BLUE CROSS/BLUE SHIELD | Admitting: Family Medicine

## 2019-03-27 ENCOUNTER — Other Ambulatory Visit: Payer: Self-pay

## 2019-03-27 DIAGNOSIS — Z20822 Contact with and (suspected) exposure to covid-19: Secondary | ICD-10-CM

## 2019-03-29 LAB — NOVEL CORONAVIRUS, NAA: SARS-CoV-2, NAA: NOT DETECTED

## 2019-05-11 ENCOUNTER — Other Ambulatory Visit: Payer: Self-pay

## 2019-05-11 ENCOUNTER — Encounter: Payer: Self-pay | Admitting: Nurse Practitioner

## 2019-05-11 ENCOUNTER — Ambulatory Visit (INDEPENDENT_AMBULATORY_CARE_PROVIDER_SITE_OTHER): Payer: 59 | Admitting: Nurse Practitioner

## 2019-05-11 VITALS — BP 142/74 | HR 98 | Temp 99.0°F | Resp 20 | Ht 64.0 in | Wt 210.0 lb

## 2019-05-11 DIAGNOSIS — N3000 Acute cystitis without hematuria: Secondary | ICD-10-CM | POA: Diagnosis not present

## 2019-05-11 DIAGNOSIS — R300 Dysuria: Secondary | ICD-10-CM

## 2019-05-11 LAB — URINALYSIS, COMPLETE
Bilirubin, UA: NEGATIVE
Glucose, UA: NEGATIVE
Ketones, UA: NEGATIVE
Leukocytes,UA: NEGATIVE
Nitrite, UA: NEGATIVE
Protein,UA: NEGATIVE
Specific Gravity, UA: 1.015 (ref 1.005–1.030)
Urobilinogen, Ur: 0.2 mg/dL (ref 0.2–1.0)
pH, UA: 6 (ref 5.0–7.5)

## 2019-05-11 LAB — MICROSCOPIC EXAMINATION: Bacteria, UA: NONE SEEN

## 2019-05-11 MED ORDER — AMOXICILLIN-POT CLAVULANATE 875-125 MG PO TABS: 1.0000 | ORAL_TABLET | Freq: Two times a day (BID) | ORAL | 0 refills | Status: DC

## 2019-05-11 NOTE — Patient Instructions (Signed)
Urinary Tract Infection, Adult A urinary tract infection (UTI) is an infection of any part of the urinary tract. The urinary tract includes:  The kidneys.  The ureters.  The bladder.  The urethra. These organs make, store, and get rid of pee (urine) in the body. What are the causes? This is caused by germs (bacteria) in your genital area. These germs grow and cause swelling (inflammation) of your urinary tract. What increases the risk? You are more likely to develop this condition if:  You have a small, thin tube (catheter) to drain pee.  You cannot control when you pee or poop (incontinence).  You are female, and: ? You use these methods to prevent pregnancy:  A medicine that kills sperm (spermicide).  A device that blocks sperm (diaphragm). ? You have low levels of a female hormone (estrogen). ? You are pregnant.  You have genes that add to your risk.  You are sexually active.  You take antibiotic medicines.  You have trouble peeing because of: ? A prostate that is bigger than normal, if you are female. ? A blockage in the part of your body that drains pee from the bladder (urethra). ? A kidney stone. ? A nerve condition that affects your bladder (neurogenic bladder). ? Not getting enough to drink. ? Not peeing often enough.  You have other conditions, such as: ? Diabetes. ? A weak disease-fighting system (immune system). ? Sickle cell disease. ? Gout. ? Injury of the spine. What are the signs or symptoms? Symptoms of this condition include:  Needing to pee right away (urgently).  Peeing often.  Peeing small amounts often.  Pain or burning when peeing.  Blood in the pee.  Pee that smells bad or not like normal.  Trouble peeing.  Pee that is cloudy.  Fluid coming from the vagina, if you are female.  Pain in the belly or lower back. Other symptoms include:  Throwing up (vomiting).  No urge to eat.  Feeling mixed up (confused).  Being tired  and grouchy (irritable).  A fever.  Watery poop (diarrhea). How is this treated? This condition may be treated with:  Antibiotic medicine.  Other medicines.  Drinking enough water. Follow these instructions at home:  Medicines  Take over-the-counter and prescription medicines only as told by your doctor.  If you were prescribed an antibiotic medicine, take it as told by your doctor. Do not stop taking it even if you start to feel better. General instructions  Make sure you: ? Pee until your bladder is empty. ? Do not hold pee for a long time. ? Empty your bladder after sex. ? Wipe from front to back after pooping if you are a female. Use each tissue one time when you wipe.  Drink enough fluid to keep your pee pale yellow.  Keep all follow-up visits as told by your doctor. This is important. Contact a doctor if:  You do not get better after 1-2 days.  Your symptoms go away and then come back. Get help right away if:  You have very bad back pain.  You have very bad pain in your lower belly.  You have a fever.  You are sick to your stomach (nauseous).  You are throwing up. Summary  A urinary tract infection (UTI) is an infection of any part of the urinary tract.  This condition is caused by germs in your genital area.  There are many risk factors for a UTI. These include having a small, thin   tube to drain pee and not being able to control when you pee or poop.  Treatment includes antibiotic medicines for germs.  Drink enough fluid to keep your pee pale yellow. This information is not intended to replace advice given to you by your health care provider. Make sure you discuss any questions you have with your health care provider. Document Revised: 04/09/2018 Document Reviewed: 10/30/2017 Elsevier Patient Education  2020 [employer] Inc[employer]  

## 2019-05-11 NOTE — Progress Notes (Signed)
   Subjective:    Patient ID: Tammy Lawson, female    DOB: [birth year], 64 y.o.   MRN: [id number]  . Chief Complaint: Urinary Tract Infection   HPI Patient come s in c/o urinary frequency and urgency with some burning. Started over a week ago. Has been taking AZO and drinking a lot of water. SHe had some amoxicilin at home and started it 4 days ago.    Review of Systems  Constitutional: Negative for diaphoresis.  Eyes: Negative for pain.  Respiratory: Negative for shortness of breath.   Cardiovascular: Negative for chest pain, palpitations and leg swelling.  Gastrointestinal: Negative for abdominal pain.  Endocrine: Negative for polydipsia.  Skin: Negative for rash.  Neurological: Negative for dizziness, weakness and headaches.  Hematological: Does not bruise/bleed easily.  All other systems reviewed and are negative.      Objective:   Physical Exam Vitals and nursing note reviewed.  Cardiovascular:     Rate and Rhythm: Regular rhythm.  Pulmonary:     Breath sounds: Normal breath sounds.  Abdominal:     General: Abdomen is flat.     Palpations: Abdomen is soft.     Tenderness: There is no right CVA tenderness or left CVA tenderness.     BP (!) 142/74 (BP Location: Right Arm, Cuff Size: Large)   Pulse 98   Temp 99 F (37.2 C)   Resp 20   Ht 5\' 4"  (1.626 m)   Wt 210 lb (95.3 kg)   SpO2 100%   BMI 36.05 kg/m        Assessment & Plan:  Tammy Lawson in today with chief complaint of Urinary Tract Infection   1. Dysuria - Urine culture - urinalysis- dip and micro  2. Acute cystitis without hematuria Take medication as prescribe Cotton underwear Take shower not bath Cranberry juice, yogurt Force fluids AZO over the counter X2 days Culture pending RTO prn  - Urine culture - amoxicillin-clavulanate (AUGMENTIN) 875-125 MG tablet; Take 1 tablet by mouth 2 (two) times daily.  Dispense: 14 tablet; Refill: 0  Mary-Margaret Hassell Done, FNP

## 2019-05-13 LAB — URINE CULTURE

## 2019-05-18 ENCOUNTER — Telehealth: Payer: Self-pay | Admitting: *Deleted

## 2019-05-18 ENCOUNTER — Encounter (INDEPENDENT_AMBULATORY_CARE_PROVIDER_SITE_OTHER): Payer: Self-pay

## 2019-05-18 ENCOUNTER — Ambulatory Visit (INDEPENDENT_AMBULATORY_CARE_PROVIDER_SITE_OTHER): Payer: 59 | Admitting: Family

## 2019-05-18 ENCOUNTER — Encounter: Payer: Self-pay | Admitting: Family

## 2019-05-18 DIAGNOSIS — Z20822 Contact with and (suspected) exposure to covid-19: Secondary | ICD-10-CM

## 2019-05-18 NOTE — Progress Notes (Signed)
   Virtual Visit via telephone Note Due to COVID-19 pandemic this visit was conducted virtually. This visit type was conducted due to national recommendations for restrictions regarding the COVID-19 Pandemic (e.g. social distancing, sheltering in place) in an effort to limit this patient's exposure and mitigate transmission in our community. All issues noted in this document were discussed and addressed.  A physical exam was not performed with this format.  I connected with Tammy Lawson on 05/18/19 at 12:15 pm  by telephone and verified that I am speaking with the correct person using two identifiers. Tammy Lawson is currently located at home  and no one  is currently with her during visit. The provider, Evelina Dun, FNP is located in their office at time of visit.  I discussed the limitations, risks, security and privacy concerns of performing an evaluation and management service by telephone and the availability of in person appointments. I also discussed with the patient that there may be a patient responsible charge related to this service. The patient expressed understanding and agreed to proceed.   History and Present Illness:  Pt calls the office today with fevers, fatigue, headache, and decrease appetite started on 05/14/19. She is currently taking Augmentin for a UTI that she started 05/05/19. She reports her UTI symptoms have resolved and denies any flank pain or abdominal pain.  Fever  This is a new problem. The current episode started 1 to 4 weeks ago. The problem occurs intermittently. Associated symptoms include coughing, ear pain, headaches, muscle aches and sleepiness. Pertinent negatives include no congestion, diarrhea or wheezing. She has tried acetaminophen for the symptoms. The treatment provided mild relief.      Review of Systems  Constitutional: Positive for fever.  HENT: Positive for ear pain. Negative for congestion.   Respiratory: Positive for cough. Negative for  wheezing.   Gastrointestinal: Negative for diarrhea.  Neurological: Positive for headaches.  All other systems reviewed and are negative.    Observations/Objective: No SOB or distress noted   Assessment and Plan: Tammy Lawson comes in today with chief complaint of No chief complaint on file.   Diagnosis and orders addressed:  1. Encounter by telehealth for suspected COVID-19 -Pt will go get COVID tested Rest Tylenol  Self isolation until results return Force fluids Call if symptoms worsen or do not imporve      I discussed the assessment and treatment plan with the patient. The patient was provided an opportunity to ask questions and all were answered. The patient agreed with the plan and demonstrated an understanding of the instructions.   The patient was advised to call back or seek an in-person evaluation if the symptoms worsen or if the condition fails to improve as anticipated.  The above assessment and management plan was discussed with the patient. The patient verbalized understanding of and has agreed to the management plan. Patient is aware to call the clinic if symptoms persist or worsen. Patient is aware when to return to the clinic for a follow-up visit. Patient educated on when it is appropriate to go to the emergency department.   Time call ended:  12:25 pm   I provided 10 minutes of non-face-to-face time during this encounter.    Evelina Dun, FNP

## 2019-05-18 NOTE — Telephone Encounter (Signed)
Attempted to reach pt after receiving BPA via MyChart Covid Symptom Monitoring for temp of 100.8. Unable to reach, will address in MyChart message.

## 2019-05-19 ENCOUNTER — Encounter (INDEPENDENT_AMBULATORY_CARE_PROVIDER_SITE_OTHER): Payer: Self-pay

## 2019-05-19 ENCOUNTER — Telehealth: Payer: Self-pay | Admitting: *Deleted

## 2019-05-19 ENCOUNTER — Other Ambulatory Visit: Payer: Self-pay

## 2019-05-19 NOTE — Telephone Encounter (Signed)
Call to patient- patient states she is having symptoms of COVID and she is scheduled for test today. She had visit with her PCP yesterday and is following recommendations. Patient is hydrating, she is aware to treat fever if 101, she is aware to treat cough if needed with OTC medications and if it becomes productive with color contact PCP. Advised of severe symptoms and when to seek help: trouble breathing, dehydration or severe weakness. Patient is aware that she may be contacted by nurses to follow up on her answers to Leonard- companion.

## 2019-05-20 ENCOUNTER — Encounter (INDEPENDENT_AMBULATORY_CARE_PROVIDER_SITE_OTHER): Payer: Self-pay

## 2019-05-22 ENCOUNTER — Encounter (INDEPENDENT_AMBULATORY_CARE_PROVIDER_SITE_OTHER): Payer: Self-pay

## 2019-05-23 ENCOUNTER — Encounter (INDEPENDENT_AMBULATORY_CARE_PROVIDER_SITE_OTHER): Payer: Self-pay

## 2019-05-24 ENCOUNTER — Encounter (INDEPENDENT_AMBULATORY_CARE_PROVIDER_SITE_OTHER): Payer: Self-pay

## 2019-05-28 ENCOUNTER — Encounter (INDEPENDENT_AMBULATORY_CARE_PROVIDER_SITE_OTHER): Payer: Self-pay

## 2019-06-22 ENCOUNTER — Other Ambulatory Visit: Payer: Self-pay | Admitting: Family Medicine

## 2019-06-22 DIAGNOSIS — E559 Vitamin D deficiency, unspecified: Secondary | ICD-10-CM

## 2019-06-22 NOTE — Telephone Encounter (Signed)
Vitamin D refill request Last Vit D 01/2019 Has f/u appt with PCP 07/21/18

## 2019-07-15 ENCOUNTER — Other Ambulatory Visit: Payer: Self-pay | Admitting: Family Medicine

## 2019-07-21 ENCOUNTER — Ambulatory Visit: Payer: BC Managed Care – PPO | Admitting: Family Medicine

## 2019-07-21 ENCOUNTER — Other Ambulatory Visit: Payer: 59

## 2019-07-22 ENCOUNTER — Encounter: Payer: Self-pay | Admitting: Family Medicine

## 2019-07-22 ENCOUNTER — Other Ambulatory Visit: Payer: Self-pay

## 2019-07-22 ENCOUNTER — Ambulatory Visit (INDEPENDENT_AMBULATORY_CARE_PROVIDER_SITE_OTHER): Payer: 59 | Admitting: Family Medicine

## 2019-07-22 ENCOUNTER — Ambulatory Visit (INDEPENDENT_AMBULATORY_CARE_PROVIDER_SITE_OTHER): Payer: 59

## 2019-07-22 VITALS — BP 140/75 | HR 78 | Temp 99.0°F | Resp 20 | Ht 64.0 in | Wt 205.0 lb

## 2019-07-22 DIAGNOSIS — Z6836 Body mass index (BMI) 36.0-36.9, adult: Secondary | ICD-10-CM | POA: Diagnosis not present

## 2019-07-22 DIAGNOSIS — Z13820 Encounter for screening for osteoporosis: Secondary | ICD-10-CM

## 2019-07-22 DIAGNOSIS — Z780 Asymptomatic menopausal state: Secondary | ICD-10-CM

## 2019-07-22 DIAGNOSIS — E7800 Pure hypercholesterolemia, unspecified: Secondary | ICD-10-CM | POA: Diagnosis not present

## 2019-07-22 DIAGNOSIS — E559 Vitamin D deficiency, unspecified: Secondary | ICD-10-CM | POA: Diagnosis not present

## 2019-07-22 DIAGNOSIS — I10 Essential (primary) hypertension: Secondary | ICD-10-CM | POA: Diagnosis not present

## 2019-07-22 DIAGNOSIS — Z0000 Encounter for general adult medical examination without abnormal findings: Secondary | ICD-10-CM | POA: Diagnosis not present

## 2019-07-22 MED ORDER — LOSARTAN POTASSIUM 50 MG PO TABS: ORAL_TABLET | ORAL | 1 refills | Status: DC

## 2019-07-22 MED ORDER — ROSUVASTATIN CALCIUM 10 MG PO TABS: ORAL_TABLET | ORAL | 0 refills | Status: DC

## 2019-07-22 MED ORDER — VITAMIN D (ERGOCALCIFEROL) 1.25 MG (50000 UNIT) PO CAPS: ORAL_CAPSULE | ORAL | 1 refills | Status: DC

## 2019-07-22 NOTE — Progress Notes (Signed)
Subjective:  Patient ID: Tammy Lawson, female    DOB: 02-18-1956, 64 y.o.   MRN: [id number]  Patient Care Team: Baruch Gouty, FNP as PCP - General (Family Medicine)   Chief Complaint:  Medical Management of Chronic Issues (6 mo ), Hyperlipidemia, and Hypertension   HPI: Tammy Lawson is a 64 y.o. female presenting on 07/22/2019 for Medical Management of Chronic Issues (6 mo ), Hyperlipidemia, and Hypertension   1. Essential hypertension Complaint with meds - Yes Current Medications - losartan Checking BP at home ranging 115/60 - 125/80 Exercising Regularly - no but has started exercising more Watching Salt intake - Yes Pertinent ROS:  Headache - No Fatigue - No Visual Disturbances - No Chest pain - No Dyspnea - No Palpitations - No LE edema - No They report good compliance with medications and can restate their regimen by memory. No medication side effects.  Family, social, and smoking history reviewed.   BP Readings from Last 3 Encounters:  07/22/19 140/75  05/11/19 (!) 142/74  01/20/19 128/76   CMP Latest Ref Rng & Units 01/20/2019 08/25/2018 12/26/2017  Glucose 65 - 99 mg/dL 102(H) 102(H) 94  BUN 8 - 27 mg/dL 14 16 18   Creatinine 0.57 - 1.00 mg/dL 0.73 0.77 0.72  Sodium 134 - 144 mmol/L 143 141 140  Potassium 3.5 - 5.2 mmol/L 4.3 4.4 4.6  Chloride 96 - 106 mmol/L 104 102 101  CO2 20 - 29 mmol/L 23 23 25   Calcium 8.7 - 10.3 mg/dL 9.7 9.4 9.5  Total Protein 6.0 - 8.5 g/dL 7.0 6.9 -  Total Bilirubin 0.0 - 1.2 mg/dL 0.6 0.4 -  Alkaline Phos 39 - 117 IU/L 86 91 -  AST 0 - 40 IU/L 21 18 -  ALT 0 - 32 IU/L 18 15 -      2. Vitamin D deficiency Pt is taking oral repletion therapy. Denies bone pain and tenderness, muscle weakness, fracture, and difficulty walking. Lab Results  Component Value Date   VD25OH 12.5 (L) 01/20/2019   Lab Results  Component Value Date   CALCIUM 9.7 01/20/2019      3. Pure hypercholesterolemia Compliant with medications -  Yes Current medications - Crestor Side effects from medications - No Diet - trying to eat better Exercise - has been exercising more  Lab Results  Component Value Date   CHOL 211 (H) 01/20/2019   HDL 38 (L) 01/20/2019   LDLCALC 147 (H) 01/20/2019   TRIG 144 01/20/2019   CHOLHDL 5.6 (H) 01/20/2019     Family and personal medical history reviewed. Smoking and ETOH history reviewed.    4. BMI 36.0-36.9,adult Pt has been trying to watch diet and starting to exercise more. Has lost 5 lbs.      Relevant past medical, surgical, family, and social history reviewed and updated as indicated.  Allergies and medications reviewed and updated. Date reviewed: Chart in Epic.   History reviewed. No pertinent past medical history.  History reviewed. No pertinent surgical history.  Social History   Socioeconomic History  . Marital status: Married    Spouse name: Not on file  . Number of children: Not on file  . Years of education: Not on file  . Highest education level: Not on file  Occupational History  . Not on file  Tobacco Use  . Smoking status: Never Smoker  . Smokeless tobacco: Never Used  Substance and Sexual Activity  . Alcohol use: No  . Drug use: No  .  Sexual activity: Not on file  Other Topics Concern  . Not on file  Social History Narrative  . Not on file   Social Determinants of Health   Financial Resource Strain:   . Difficulty of Paying Living Expenses:   Food Insecurity:   . Worried About Charity fundraiser in the Last Year:   . Arboriculturist in the Last Year:   Transportation Needs:   . Film/video editor (Medical):   Marland Kitchen Lack of Transportation (Non-Medical):   Physical Activity:   . Days of Exercise per Week:   . Minutes of Exercise per Session:   Stress:   . Feeling of Stress :   Social Connections:   . Frequency of Communication with Friends and Family:   . Frequency of Social Gatherings with Friends and Family:   . Attends Religious  Services:   . Active Member of Clubs or Organizations:   . Attends Archivist Meetings:   Marland Kitchen Marital Status:   Intimate Partner Violence:   . Fear of Current or Ex-Partner:   . Emotionally Abused:   Marland Kitchen Physically Abused:   . Sexually Abused:     Outpatient Encounter Medications as of 07/22/2019  Medication Sig  . losartan (COZAAR) 50 MG tablet TAKE 1 TABLET(50 MG) BY MOUTH DAILY  . rosuvastatin (CRESTOR) 10 MG tablet TAKE 1 TABLET(10 MG) BY MOUTH DAILY  . Vitamin D, Ergocalciferol, (DRISDOL) 1.25 MG (50000 UNIT) CAPS capsule TAKE 1 CAPSULE BY MOUTH EVERY 7 DAYS  . [DISCONTINUED] losartan (COZAAR) 50 MG tablet TAKE 1 TABLET(50 MG) BY MOUTH DAILY  . [DISCONTINUED] rosuvastatin (CRESTOR) 10 MG tablet TAKE 1 TABLET(10 MG) BY MOUTH DAILY  . [DISCONTINUED] Vitamin D, Ergocalciferol, (DRISDOL) 1.25 MG (50000 UNIT) CAPS capsule TAKE 1 CAPSULE BY MOUTH EVERY 7 DAYS  . [DISCONTINUED] amoxicillin-clavulanate (AUGMENTIN) 875-125 MG tablet Take 1 tablet by mouth 2 (two) times daily.   No facility-administered encounter medications on file as of 07/22/2019.    Allergies  Allergen Reactions  . Other     Ranch dressing  - GI   . Augmentin [Amoxicillin-Pot Clavulanate] Other (See Comments)    Causes fever on 2 nd week ( this has happened x 2 occ)     Review of Systems  Constitutional: Negative for activity change, appetite change, chills, diaphoresis, fatigue, fever and unexpected weight change.  HENT: Negative.   Eyes: Negative.  Negative for photophobia and visual disturbance.  Respiratory: Negative for cough, chest tightness and shortness of breath.   Cardiovascular: Negative for chest pain, palpitations and leg swelling.  Gastrointestinal: Negative for abdominal pain, blood in stool, constipation, diarrhea, nausea and vomiting.  Endocrine: Negative.  Negative for cold intolerance, heat intolerance, polydipsia, polyphagia and polyuria.  Genitourinary: Negative for decreased urine  volume, difficulty urinating, dysuria, frequency and urgency.  Musculoskeletal: Negative for arthralgias and myalgias.  Skin: Negative.   Allergic/Immunologic: Negative.   Neurological: Negative for dizziness, tremors, seizures, syncope, facial asymmetry, speech difficulty, weakness, light-headedness, numbness and headaches.  Hematological: Negative.   Psychiatric/Behavioral: Negative for confusion, hallucinations, sleep disturbance and suicidal ideas.  All other systems reviewed and are negative.       Objective:  BP 140/75   Pulse 78   Temp 99 F (37.2 C)   Resp 20   Ht 5' 4"  (1.626 m)   Wt 205 lb (93 kg)   SpO2 100%   BMI 35.19 kg/m    Wt Readings from Last 3 Encounters:  07/22/19 205  lb (93 kg)  05/11/19 210 lb (95.3 kg)  01/20/19 210 lb (95.3 kg)    Physical Exam Vitals and nursing note reviewed.  Constitutional:      General: She is not in acute distress.    Appearance: Normal appearance. She is well-developed and well-groomed. She is obese. She is not ill-appearing, toxic-appearing or diaphoretic.  HENT:     Head: Normocephalic and atraumatic.     Jaw: There is normal jaw occlusion.     Right Ear: Hearing normal.     Left Ear: Hearing normal.     Nose: Nose normal.     Mouth/Throat:     Lips: Pink.     Mouth: Mucous membranes are moist.     Pharynx: Oropharynx is clear. Uvula midline.  Eyes:     General: Lids are normal.     Extraocular Movements: Extraocular movements intact.     Conjunctiva/sclera: Conjunctivae normal.     Pupils: Pupils are equal, round, and reactive to light.  Neck:     Thyroid: No thyroid mass, thyromegaly or thyroid tenderness.     Vascular: No carotid bruit or JVD.     Trachea: Trachea and phonation normal.  Cardiovascular:     Rate and Rhythm: Normal rate and regular rhythm.     Chest Wall: PMI is not displaced.     Pulses: Normal pulses.     Heart sounds: Normal heart sounds. No murmur. No friction rub. No gallop.    Pulmonary:     Effort: Pulmonary effort is normal. No respiratory distress.     Breath sounds: Normal breath sounds. No wheezing.  Abdominal:     General: Bowel sounds are normal. There is no distension or abdominal bruit.     Palpations: Abdomen is soft. There is no hepatomegaly or splenomegaly.     Tenderness: There is no abdominal tenderness. There is no right CVA tenderness or left CVA tenderness.     Hernia: No hernia is present.  Musculoskeletal:        General: Normal range of motion.     Cervical back: Normal range of motion and neck supple.     Right lower leg: No edema.     Left lower leg: No edema.  Lymphadenopathy:     Cervical: No cervical adenopathy.  Skin:    General: Skin is warm and dry.     Capillary Refill: Capillary refill takes less than 2 seconds.     Coloration: Skin is not cyanotic, jaundiced or pale.     Findings: No rash.  Neurological:     General: No focal deficit present.     Mental Status: She is alert and oriented to person, place, and time.     Cranial Nerves: Cranial nerves are intact. No cranial nerve deficit.     Sensory: Sensation is intact. No sensory deficit.     Motor: Motor function is intact. No weakness.     Coordination: Coordination is intact. Coordination normal.     Gait: Gait is intact. Gait normal.     Deep Tendon Reflexes: Reflexes are normal and symmetric. Reflexes normal.  Psychiatric:        Attention and Perception: Attention and perception normal.        Mood and Affect: Mood and affect normal.        Speech: Speech normal.        Behavior: Behavior normal. Behavior is cooperative.        Thought Content: Thought content normal.  Cognition and Memory: Cognition and memory normal.        Judgment: Judgment normal.     Results for orders placed or performed in visit on 05/11/19  Urine culture   Specimen: Urine   URINE  Result Value Ref Range   Urine Culture, Routine Final report    Organism ID, Bacteria Comment    Microscopic Examination   URINE  Result Value Ref Range   WBC, UA 0-5 0 - 5 /hpf   RBC 0-2 0 - 2 /hpf   Epithelial Cells (non renal) 0-10 0 - 10 /hpf   Renal Epithel, UA 0-10 (A) None seen /hpf   Bacteria, UA None seen None seen/Few  urinalysis- dip and micro  Result Value Ref Range   Specific Gravity, UA 1.015 1.005 - 1.030   pH, UA 6.0 5.0 - 7.5   Color, UA Yellow Yellow   Appearance Ur Clear Clear   Leukocytes,UA Negative Negative   Protein,UA Negative Negative/Trace   Glucose, UA Negative Negative   Ketones, UA Negative Negative   RBC, UA Trace (A) Negative   Bilirubin, UA Negative Negative   Urobilinogen, Ur 0.2 0.2 - 1.0 mg/dL   Nitrite, UA Negative Negative   Microscopic Examination See below:        Pertinent labs & imaging results that were available during my care of the patient were reviewed by me and considered in my medical decision making.  Assessment & Plan:  Madylin was seen today for medical management of chronic issues, hyperlipidemia and hypertension.  Diagnoses and all orders for this visit:  Essential hypertension BP well controlled. Changes were not made in regimen today. Goal BP is 130/80. Pt aware to report any persistent high or low readings. DASH diet and exercise encouraged. Exercise at least 150 minutes per week and increase as tolerated. Goal BMI > 25. Stress management encouraged. Avoid nicotine and tobacco product use. Avoid excessive alcohol and NSAID's. Avoid more than 2000 mg of sodium daily. Medications as prescribed. Follow up as scheduled.  -     CMP14+EGFR -     CBC with Differential/Platelet -     Lipid panel -     Thyroid Panel With TSH -     losartan (COZAAR) 50 MG tablet; TAKE 1 TABLET(50 MG) BY MOUTH DAILY  Vitamin D deficiency Labs pending. Continue repletion therapy. If indicated, will change repletion dosage. Eat foods rich in Vit D including milk, orange juice, yogurt with vitamin D added, salmon or mackerel, canned tuna fish,  cereals with vitamin D added, and cod liver oil. Get out in the sun but make sure to wear at least SPF 30 sunscreen.  -     CBC with Differential/Platelet -     VITAMIN D 25 Hydroxy (Vit-D Deficiency, Fractures) -     Vitamin D, Ergocalciferol, (DRISDOL) 1.25 MG (50000 UNIT) CAPS capsule; TAKE 1 CAPSULE BY MOUTH EVERY 7 DAYS  Pure hypercholesterolemia Diet encouraged - increase intake of fresh fruits and vegetables, increase intake of lean proteins. Bake, broil, or grill foods. Avoid fried, greasy, and fatty foods. Avoid fast foods. Increase intake of fiber-rich whole grains. Exercise encouraged - at least 150 minutes per week and advance as tolerated.  Goal BMI < 25. Continue medications as prescribed. Follow up in 3-6 months as discussed.  -     Lipid panel -     rosuvastatin (CRESTOR) 10 MG tablet; TAKE 1 TABLET(10 MG) BY MOUTH DAILY  BMI 36.0-36.9,adult Diet and exercise encouraged.  Labs pending.  -     CMP14+EGFR -     CBC with Differential/Platelet -     Lipid panel -     Thyroid Panel With TSH     Continue all other maintenance medications.  Follow up plan: Return in about 6 months (around 01/22/2020), or if symptoms worsen or fail to improve.   Medical decision-making:  25 minutes spent today reviewing the medical chart, counseling the patient/family, and documenting today's visit.  Continue healthy lifestyle choices, including diet (rich in fruits, vegetables, and lean proteins, and low in salt and simple carbohydrates) and exercise (at least 30 minutes of moderate physical activity daily).  Educational handout given for DASH diet  The above assessment and management plan was discussed with the patient. The patient verbalized understanding of and has agreed to the management plan. Patient is aware to call the clinic if they develop any new symptoms or if symptoms persist or worsen. Patient is aware when to return to the clinic for a follow-up visit. Patient educated on when  it is appropriate to go to the emergency department.   Monia Pouch, FNP-C [facility] [phone]

## 2019-07-22 NOTE — Patient Instructions (Addendum)
DASH Eating Plan DASH stands for "Dietary Approaches to Stop Hypertension." The DASH eating plan is a healthy eating plan that has been shown to reduce high blood pressure (hypertension). Additional health benefits may include reducing the risk of type 2 diabetes mellitus, heart disease, and stroke. The DASH eating plan may also help with weight loss.  WHAT DO I NEED TO KNOW ABOUT THE DASH EATING PLAN? For the DASH eating plan, you will follow these general guidelines:  Choose foods with a percent daily value for sodium of less than 5% (as listed on the food label).  Use salt-free seasonings or herbs instead of table salt or sea salt.  Check with your health care provider or pharmacist before using salt substitutes.  Eat lower-sodium products, often labeled as "lower sodium" or "no salt added."  Eat fresh foods.  Eat more vegetables, fruits, and low-fat dairy products.  Choose whole grains. Look for the word "whole" as the first word in the ingredient list.  Choose fish and skinless chicken or turkey more often than red meat. Limit fish, poultry, and meat to 6 oz (170 g) each day.  Limit sweets, desserts, sugars, and sugary drinks.  Choose heart-healthy fats.  Limit cheese to 1 oz (28 g) per day.  Eat more home-cooked food and less restaurant, buffet, and fast food.  Limit fried foods.  Cook foods using methods other than frying.  Limit canned vegetables. If you do use them, rinse them well to decrease the sodium.  When eating at a restaurant, ask that your food be prepared with less salt, or no salt if possible.  WHAT FOODS CAN I EAT? Seek help from a dietitian for individual calorie needs.  Grains Whole grain or whole wheat bread. Brown rice. Whole grain or whole wheat pasta. Quinoa, bulgur, and whole grain cereals. Low-sodium cereals. Corn or whole wheat flour tortillas. Whole grain cornbread. Whole grain crackers. Low-sodium crackers.  Vegetables Fresh or frozen  vegetables (raw, steamed, roasted, or grilled). Low-sodium or reduced-sodium tomato and vegetable juices. Low-sodium or reduced-sodium tomato sauce and paste. Low-sodium or reduced-sodium canned vegetables.   Fruits All fresh, canned (in natural juice), or frozen fruits.  Meat and Other Protein Products Ground beef (85% or leaner), grass-fed beef, or beef trimmed of fat. Skinless chicken or turkey. Ground chicken or turkey. Pork trimmed of fat. All fish and seafood. Eggs. Dried beans, peas, or lentils. Unsalted nuts and seeds. Unsalted canned beans.  Dairy Low-fat dairy products, such as skim or 1% milk, 2% or reduced-fat cheeses, low-fat ricotta or cottage cheese, or plain low-fat yogurt. Low-sodium or reduced-sodium cheeses.  Fats and Oils Tub margarines without trans fats. Light or reduced-fat mayonnaise and salad dressings (reduced sodium). Avocado. Safflower, olive, or canola oils. Natural peanut or almond butter.  Other Unsalted popcorn and pretzels. The items listed above may not be a complete list of recommended foods or beverages. Contact your dietitian for more options.  WHAT FOODS ARE NOT RECOMMENDED?  Grains White bread. White pasta. White rice. Refined cornbread. Bagels and croissants. Crackers that contain trans fat.  Vegetables Creamed or fried vegetables. Vegetables in a cheese sauce. Regular canned vegetables. Regular canned tomato sauce and paste. Regular tomato and vegetable juices.  Fruits Dried fruits. Canned fruit in light or heavy syrup. Fruit juice.  Meat and Other Protein Products Fatty cuts of meat. Ribs, chicken wings, bacon, sausage, bologna, salami, chitterlings, fatback, hot dogs, bratwurst, and packaged luncheon meats. Salted nuts and seeds. Canned beans with salt.    Dairy Whole or 2% milk, cream, half-and-half, and cream cheese. Whole-fat or sweetened yogurt. Full-fat cheeses or blue cheese. Nondairy creamers and whipped toppings. Processed cheese,  cheese spreads, or cheese curds.  Condiments Onion and garlic salt, seasoned salt, table salt, and sea salt. Canned and packaged gravies. Worcestershire sauce. Tartar sauce. Barbecue sauce. Teriyaki sauce. Soy sauce, including reduced sodium. Steak sauce. Fish sauce. Oyster sauce. Cocktail sauce. Horseradish. Ketchup and mustard. Meat flavorings and tenderizers. Bouillon cubes. Hot sauce. Tabasco sauce. Marinades. Taco seasonings. Relishes.  Fats and Oils Butter, stick margarine, lard, shortening, ghee, and bacon fat. Coconut, palm kernel, or palm oils. Regular salad dressings.  Other Pickles and olives. Salted popcorn and pretzels.  The items listed above may not be a complete list of foods and beverages to avoid. Contact your dietitian for more information.  WHERE CAN I FIND MORE INFORMATION? National Heart, Lung, and [employer] Institute: www.nhlbi.nih.gov/health/health-topics/topics/dash/ Document Released: 04/11/2011 Document Revised: 09/06/2013 Document Reviewed: 02/24/2013 Exit[employer] Patient Information 2015 [employer]. This information is not intended to replace advice given to you by your health care provider. Make sure you discuss any questions you have with your health care provider.   I think that you would greatly benefit from seeing a nutritionist.  If you are interested, please call Dr Sykes at [phone] to schedule an appointment.   

## 2019-07-23 LAB — CMP14+EGFR
ALT: 26 IU/L (ref 0–32)
AST: 21 IU/L (ref 0–40)
Albumin/Globulin Ratio: 1.9 (ref 1.2–2.2)
Albumin: 4.5 g/dL (ref 3.8–4.8)
Alkaline Phosphatase: 76 IU/L (ref 39–117)
BUN/Creatinine Ratio: 19 (ref 12–28)
BUN: 13 mg/dL (ref 8–27)
Bilirubin Total: 0.6 mg/dL (ref 0.0–1.2)
CO2: 25 mmol/L (ref 20–29)
Calcium: 9.5 mg/dL (ref 8.7–10.3)
Chloride: 104 mmol/L (ref 96–106)
Creatinine, Ser: 0.7 mg/dL (ref 0.57–1.00)
GFR calc Af Amer: 106 mL/min/{1.73_m2} (ref >59)
GFR calc non Af Amer: 92 mL/min/{1.73_m2} (ref >59)
Globulin, Total: 2.4 g/dL (ref 1.5–4.5)
Glucose: 98 mg/dL (ref 65–99)
Potassium: 4.6 mmol/L (ref 3.5–5.2)
Sodium: 142 mmol/L (ref 134–144)
Total Protein: 6.9 g/dL (ref 6.0–8.5)

## 2019-07-23 LAB — CBC WITH DIFFERENTIAL/PLATELET
Basophils Absolute: 0 10*3/uL (ref 0.0–0.2)
Basos: 1 %
EOS (ABSOLUTE): 0.1 10*3/uL (ref 0.0–0.4)
Eos: 2 %
Hematocrit: 42 % (ref 34.0–46.6)
Hemoglobin: 13.4 g/dL (ref 11.1–15.9)
Immature Grans (Abs): 0 10*3/uL (ref 0.0–0.1)
Immature Granulocytes: 0 %
Lymphocytes Absolute: 1 10*3/uL (ref 0.7–3.1)
Lymphs: 18 %
MCH: 29.5 pg (ref 26.6–33.0)
MCHC: 31.9 g/dL (ref 31.5–35.7)
MCV: 93 fL (ref 79–97)
Monocytes Absolute: 0.3 10*3/uL (ref 0.1–0.9)
Monocytes: 5 %
Neutrophils Absolute: 4.2 10*3/uL (ref 1.4–7.0)
Neutrophils: 74 %
Platelets: 208 10*3/uL (ref 150–450)
RBC: 4.54 x10E6/uL (ref 3.77–5.28)
RDW: 13 % (ref 11.7–15.4)
WBC: 5.6 10*3/uL (ref 3.4–10.8)

## 2019-07-23 LAB — LIPID PANEL
Chol/HDL Ratio: 2.6 ratio (ref 0.0–4.4)
Cholesterol, Total: 113 mg/dL (ref 100–199)
HDL: 43 mg/dL (ref >39)
LDL Chol Calc (NIH): 50 mg/dL (ref 0–99)
Triglycerides: 112 mg/dL (ref 0–149)
VLDL Cholesterol Cal: 20 mg/dL (ref 5–40)

## 2019-07-23 LAB — THYROID PANEL WITH TSH
Free Thyroxine Index: 1.9 (ref 1.2–4.9)
T3 Uptake Ratio: 24 % (ref 24–39)
T4, Total: 8.1 ug/dL (ref 4.5–12.0)
TSH: 0.53 u[IU]/mL (ref 0.450–4.500)

## 2019-07-23 LAB — VITAMIN D 25 HYDROXY (VIT D DEFICIENCY, FRACTURES): Vit D, 25-Hydroxy: 53.7 ng/mL (ref 30.0–100.0)

## 2019-10-14 DIAGNOSIS — H2513 Age-related nuclear cataract, bilateral: Secondary | ICD-10-CM | POA: Diagnosis not present

## 2019-12-03 ENCOUNTER — Other Ambulatory Visit: Payer: Self-pay | Admitting: *Deleted

## 2019-12-03 DIAGNOSIS — E559 Vitamin D deficiency, unspecified: Secondary | ICD-10-CM

## 2020-01-17 ENCOUNTER — Telehealth: Payer: Self-pay | Admitting: Family Medicine

## 2020-01-17 ENCOUNTER — Other Ambulatory Visit: Payer: Self-pay | Admitting: Family

## 2020-01-17 DIAGNOSIS — E7800 Pure hypercholesterolemia, unspecified: Secondary | ICD-10-CM

## 2020-01-17 MED ORDER — ROSUVASTATIN CALCIUM 10 MG PO TABS: ORAL_TABLET | ORAL | 0 refills | Status: DC

## 2020-01-17 NOTE — Telephone Encounter (Signed)
  Prescription Request  01/17/2020  What is the name of the medication or equipment? rosuvastatin (CRESTOR) 10 MG tablet   Have you contacted your pharmacy to request a refill? (if applicable) yes  Which pharmacy would you like this sent to? [facility] [place], pt was seeing Rakes has appt with Christy on 01/25/20, she has two pills left    Patient notified that their request is being sent to the clinical staff for review and that they should receive a response within 2 business days.

## 2020-01-17 NOTE — Telephone Encounter (Signed)
Refill sen to the pharmacy 30 day supply.

## 2020-01-25 ENCOUNTER — Encounter: Payer: Self-pay | Admitting: Family

## 2020-01-25 ENCOUNTER — Other Ambulatory Visit: Payer: Self-pay

## 2020-01-25 ENCOUNTER — Ambulatory Visit (INDEPENDENT_AMBULATORY_CARE_PROVIDER_SITE_OTHER): Payer: Medicare Other | Admitting: Family

## 2020-01-25 VITALS — BP 144/69 | HR 73 | Temp 97.2°F | Ht 64.0 in | Wt 206.2 lb

## 2020-01-25 DIAGNOSIS — I10 Essential (primary) hypertension: Secondary | ICD-10-CM

## 2020-01-25 DIAGNOSIS — E559 Vitamin D deficiency, unspecified: Secondary | ICD-10-CM

## 2020-01-25 DIAGNOSIS — Z6836 Body mass index (BMI) 36.0-36.9, adult: Secondary | ICD-10-CM

## 2020-01-25 DIAGNOSIS — R7303 Prediabetes: Secondary | ICD-10-CM

## 2020-01-25 DIAGNOSIS — E7800 Pure hypercholesterolemia, unspecified: Secondary | ICD-10-CM | POA: Diagnosis not present

## 2020-01-25 DIAGNOSIS — Z1211 Encounter for screening for malignant neoplasm of colon: Secondary | ICD-10-CM

## 2020-01-25 LAB — BAYER DCA HB A1C WAIVED: HB A1C (BAYER DCA - WAIVED): 5.8 % (ref <7.0)

## 2020-01-25 MED ORDER — LOSARTAN POTASSIUM 50 MG PO TABS: ORAL_TABLET | ORAL | 1 refills | Status: DC

## 2020-01-25 MED ORDER — ROSUVASTATIN CALCIUM 10 MG PO TABS: 10.0000 mg | ORAL_TABLET | Freq: Every day | ORAL | 3 refills | Status: DC

## 2020-01-25 NOTE — Patient Instructions (Signed)

## 2020-01-25 NOTE — Progress Notes (Signed)
Subjective:    Patient ID: Tammy Lawson, female    DOB: 18-Feb-1956, 64 y.o.   MRN: [id number]  Chief Complaint  Patient presents with  . Hypertension    RAKES PATIENT, HAS ATE A LITTLE, tonsil stone thinks one is stuck in her throat    PT presents to the office today for chronic follow up.  Hypertension This is a chronic problem. The current episode started more than 1 year ago. The problem has been waxing and waning since onset. The problem is uncontrolled. Pertinent negatives include no blurred vision, malaise/fatigue, peripheral edema or shortness of breath. Risk factors for coronary artery disease include diabetes mellitus, obesity and sedentary lifestyle. The current treatment provides moderate improvement. There is no history of CVA or heart failure.  Hyperlipidemia This is a chronic problem. The current episode started more than 1 year ago. The problem is controlled. Exacerbating diseases include obesity. She has no history of nephrotic syndrome. Pertinent negatives include no shortness of breath. Current antihyperlipidemic treatment includes statins. The current treatment provides moderate improvement of lipids. Risk factors for coronary artery disease include dyslipidemia, diabetes mellitus, hypertension and a sedentary lifestyle.  Diabetes She presents for her follow-up diabetic visit. She has type 2 diabetes mellitus. Her disease course has been stable. There are no hypoglycemic associated symptoms. Pertinent negatives for diabetes include no blurred vision and no foot paresthesias. Symptoms are stable. Pertinent negatives for diabetic complications include no CVA. Risk factors for coronary artery disease include dyslipidemia, diabetes mellitus, hypertension, post-menopausal and sedentary lifestyle. She is following a generally healthy diet. Her overall blood glucose range is 90-110 mg/dl.      Review of Systems  Constitutional: Negative for malaise/fatigue.  Eyes: Negative for  blurred vision.  Respiratory: Negative for shortness of breath.   All other systems reviewed and are negative.      Objective:   Physical Exam Vitals reviewed.  Constitutional:      General: She is not in acute distress.    Appearance: She is well-developed.  HENT:     Head: Normocephalic and atraumatic.     Right Ear: Tympanic membrane normal.     Left Ear: Tympanic membrane normal.  Eyes:     Pupils: Pupils are equal, round, and reactive to light.  Neck:     Thyroid: No thyromegaly.  Cardiovascular:     Rate and Rhythm: Normal rate and regular rhythm.     Heart sounds: Normal heart sounds. No murmur heard.   Pulmonary:     Effort: Pulmonary effort is normal. No respiratory distress.     Breath sounds: Normal breath sounds. No wheezing.  Abdominal:     General: Bowel sounds are normal. There is no distension.     Palpations: Abdomen is soft.     Tenderness: There is no abdominal tenderness.  Musculoskeletal:        General: No tenderness. Normal range of motion.     Cervical back: Normal range of motion and neck supple.  Skin:    General: Skin is warm and dry.  Neurological:     Mental Status: She is alert and oriented to person, place, and time.     Cranial Nerves: No cranial nerve deficit.     Deep Tendon Reflexes: Reflexes are normal and symmetric.  Psychiatric:        Behavior: Behavior normal.        Thought Content: Thought content normal.        Judgment: Judgment normal.  BP (!) 144/69   Pulse 73   Temp (!) 97.2 F (36.2 C) (Temporal)   Ht 5' 4"  (1.626 m)   Wt 206 lb 3.2 oz (93.5 kg)   SpO2 100%   BMI 35.39 kg/m      Assessment & Plan:  Tammy Lawson comes in today with chief complaint of Hypertension (RAKES PATIENT, HAS ATE A LITTLE, tonsil stone thinks one is stuck in her throat )   Diagnosis and orders addressed:  1. Essential hypertension - losartan (COZAAR) 50 MG tablet; TAKE 1 TABLET(50 MG) BY MOUTH DAILY  Dispense: 90 tablet;  Refill: 1 - CMP14+EGFR - CBC with Differential/Platelet  2. Prediabetes - CMP14+EGFR - CBC with Differential/Platelet - Bayer DCA Hb A1c Waived  3. Pure hypercholesterolemia - rosuvastatin (CRESTOR) 10 MG tablet; Take 1 tablet (10 mg total) by mouth daily.  Dispense: 90 tablet; Refill: 3 - CMP14+EGFR - CBC with Differential/Platelet  4. Vitamin D deficiency - CMP14+EGFR - CBC with Differential/Platelet  5. Morbid obesity ([place]) - CMP14+EGFR - CBC with Differential/Platelet  6. BMI 36.0-36.9,adult - CMP14+EGFR - CBC with Differential/Platelet  7. Colon cancer screening - Cologuard - CMP14+EGFR - CBC with Differential/Platelet   Labs pending Health Maintenance reviewed Diet and exercise encouraged  Follow up plan: 6 months   Evelina Dun, FNP

## 2020-01-26 LAB — CBC WITH DIFFERENTIAL/PLATELET
Basophils Absolute: 0 10*3/uL (ref 0.0–0.2)
Basos: 1 %
EOS (ABSOLUTE): 0.1 10*3/uL (ref 0.0–0.4)
Eos: 2 %
Hematocrit: 44.1 % (ref 34.0–46.6)
Hemoglobin: 13.9 g/dL (ref 11.1–15.9)
Immature Grans (Abs): 0 10*3/uL (ref 0.0–0.1)
Immature Granulocytes: 0 %
Lymphocytes Absolute: 1.3 10*3/uL (ref 0.7–3.1)
Lymphs: 20 %
MCH: 29.4 pg (ref 26.6–33.0)
MCHC: 31.5 g/dL (ref 31.5–35.7)
MCV: 93 fL (ref 79–97)
Monocytes Absolute: 0.4 10*3/uL (ref 0.1–0.9)
Monocytes: 6 %
Neutrophils Absolute: 4.4 10*3/uL (ref 1.4–7.0)
Neutrophils: 71 %
Platelets: 210 10*3/uL (ref 150–450)
RBC: 4.73 x10E6/uL (ref 3.77–5.28)
RDW: 13.2 % (ref 11.7–15.4)
WBC: 6.2 10*3/uL (ref 3.4–10.8)

## 2020-01-26 LAB — CMP14+EGFR
ALT: 18 IU/L (ref 0–32)
AST: 18 IU/L (ref 0–40)
Albumin/Globulin Ratio: 1.8 (ref 1.2–2.2)
Albumin: 4.6 g/dL (ref 3.8–4.8)
Alkaline Phosphatase: 77 IU/L (ref 44–121)
BUN/Creatinine Ratio: 22 (ref 12–28)
BUN: 15 mg/dL (ref 8–27)
Bilirubin Total: 0.6 mg/dL (ref 0.0–1.2)
CO2: 26 mmol/L (ref 20–29)
Calcium: 9.5 mg/dL (ref 8.7–10.3)
Chloride: 103 mmol/L (ref 96–106)
Creatinine, Ser: 0.69 mg/dL (ref 0.57–1.00)
GFR calc Af Amer: 106 mL/min/{1.73_m2} (ref >59)
GFR calc non Af Amer: 92 mL/min/{1.73_m2} (ref >59)
Globulin, Total: 2.5 g/dL (ref 1.5–4.5)
Glucose: 98 mg/dL (ref 65–99)
Potassium: 5 mmol/L (ref 3.5–5.2)
Sodium: 143 mmol/L (ref 134–144)
Total Protein: 7.1 g/dL (ref 6.0–8.5)

## 2020-02-01 ENCOUNTER — Telehealth: Payer: Self-pay | Admitting: Family

## 2020-02-01 NOTE — Telephone Encounter (Signed)
Patient aware and verbalized understanding. °

## 2020-02-01 NOTE — Telephone Encounter (Signed)
Her problem list has prediabetes on it? I am unsure why it said DM.

## 2020-03-02 DIAGNOSIS — Z1211 Encounter for screening for malignant neoplasm of colon: Secondary | ICD-10-CM | POA: Diagnosis not present

## 2020-03-13 ENCOUNTER — Other Ambulatory Visit: Payer: Self-pay | Admitting: Family

## 2020-03-13 DIAGNOSIS — R195 Other fecal abnormalities: Secondary | ICD-10-CM

## 2020-03-13 LAB — COLOGUARD: Cologuard: POSITIVE — AB

## 2020-04-10 ENCOUNTER — Encounter: Payer: Self-pay | Admitting: Gastroenterology

## 2020-04-14 ENCOUNTER — Other Ambulatory Visit: Payer: Self-pay | Admitting: Nurse Practitioner

## 2020-04-14 DIAGNOSIS — E7800 Pure hypercholesterolemia, unspecified: Secondary | ICD-10-CM

## 2020-05-19 ENCOUNTER — Ambulatory Visit: Payer: Medicare Other | Admitting: Gastroenterology

## 2020-05-19 ENCOUNTER — Encounter: Payer: Self-pay | Admitting: Gastroenterology

## 2020-05-19 VITALS — BP 144/78 | HR 88 | Ht 62.0 in | Wt 211.4 lb

## 2020-05-19 DIAGNOSIS — R195 Other fecal abnormalities: Secondary | ICD-10-CM

## 2020-05-19 MED ORDER — SUPREP BOWEL PREP KIT 17.5-3.13-1.6 GM/177ML PO SOLN: 1.0000 | Freq: Once | ORAL | 0 refills | Status: AC

## 2020-05-19 MED ORDER — ONDANSETRON HCL 4 MG PO TABS: ORAL_TABLET | ORAL | 0 refills | Status: DC

## 2020-05-19 NOTE — Patient Instructions (Signed)
We have sent 5 Zofran tablets to your pharmacy, Take 1 Zofran tablet 30 minutes before the start of your colonoscopy prep the night before your procdure. Then you will take 1 Zofran tablet 30 minutes before the start of your second dose of colonoscopy prep. You will have 3 remaining tablets to take as needed for nausea from the prep  You have been scheduled for a colonoscopy. Please follow written instructions given to you at your visit today.  Please pick up your prep supplies at the pharmacy within the next 1-3 days. If you use inhalers (even only as needed), please bring them with you on the day of your procedure.   If you are age 51 or older, your body mass index should be between 23-30. Your Body mass index is 38.66 kg/m. If this is out of the aforementioned range listed, please consider follow up with your Primary Care Provider.  If you are age 9 or younger, your body mass index should be between 19-25. Your Body mass index is 38.66 kg/m. If this is out of the aformentioned range listed, please consider follow up with your Primary Care Provider.    Due to recent changes in healthcare laws, you may see the results of your imaging and laboratory studies on MyChart before your provider has had a chance to review them.  We understand that in some cases there may be results that are confusing or concerning to you. Not all laboratory results come back in the same time frame and the provider may be waiting for multiple results in order to interpret others.  Please give [place] 48 hours in order for your provider to thoroughly review all the results before contacting the office for clarification of your results.   Thank you for choosing [facility]  Karleen Hampshire Nandigam,MD

## 2020-05-19 NOTE — Progress Notes (Signed)
Tammy Lawson    [id number]    1955/08/06  Primary Care Physician:Hawks, Theador Hawthorne, FNP  Referring Physician: Sharion Balloon, [address] [place],  [place] [address]   Chief complaint:  Positive Cologuard   HPI:  65 year old very pleasant female here for evaluation of positive Cologuard  She currently has no specific GI symptoms. Denies any nausea, vomiting, abdominal pain, melena or bright red blood per rectum Bowel habits are regular.  Denies any decreased appetite or weight loss.  Her father was diagnosed with colon cancer in his 65s and passed away in 08/30/2014 at age 3  Her sister has Crohn's disease   Outpatient Encounter Medications as of 05/19/2020  Medication Sig  . Ascorbic Acid (VITAMIN C) 100 MG tablet Take 100 mg by mouth daily.  Marland Kitchen CALCIUM-MAGNESIUM-ZINC PO Take 1 capsule by mouth once a week.  . losartan (COZAAR) 50 MG tablet TAKE 1 TABLET(50 MG) BY MOUTH DAILY  . rosuvastatin (CRESTOR) 10 MG tablet TAKE 1 TABLET(10 MG) BY MOUTH DAILY  . Vitamin D, Ergocalciferol, (DRISDOL) 1.25 MG (50000 UNIT) CAPS capsule TAKE 1 CAPSULE BY MOUTH EVERY 7 DAYS   No facility-administered encounter medications on file as of 05/19/2020.    Allergies as of 05/19/2020 - Review Complete 05/19/2020  Allergen Reaction Noted  . Other  01/20/2019  . Augmentin [amoxicillin-pot clavulanate] Other (See Comments) 07/22/2019    Past Medical History:  Diagnosis Date  . HLD (hyperlipidemia)   . HTN (hypertension)   . Vitamin D deficiency     Past Surgical History:  Procedure Laterality Date  . NO PAST SURGERIES      Family History  Problem Relation Age of Onset  . Diabetes Mother   . Heart disease Mother   . Stroke Mother   . Hypertension Mother   . Hyperlipidemia Mother   . COPD Father   . Rectal cancer Father   . Colon polyps Father   . Atrial fibrillation Father   . Hyperlipidemia Father   . Prostate cancer Father   . Lung disease Father         tumor on lung  . Cancer Sister        Mets and don't know origin  . Diabetes Paternal Grandmother   . Albinism Sister   . Pulmonary fibrosis Sister   . Other Sister        Hermansky pudlac syndrome  . Diabetes Son   . Hypertension Son   . Hyperlipidemia Son     Social History   Socioeconomic History  . Marital status: Married    Spouse name: Not on file  . Number of children: Not on file  . Years of education: Not on file  . Highest education level: Not on file  Occupational History  . Occupation: retired  Tobacco Use  . Smoking status: Never Smoker  . Smokeless tobacco: Never Used  Vaping Use  . Vaping Use: Never used  Substance and Sexual Activity  . Alcohol use: No  . Drug use: No  . Sexual activity: Not on file  Other Topics Concern  . Not on file  Social History Narrative  . Not on file   Social Determinants of Health   Financial Resource Strain: Not on file  Food Insecurity: Not on file  Transportation Needs: Not on file  Physical Activity: Not on file  Stress: Not on file  Social Connections: Not on file  Intimate Partner  Violence: Not on file      Review of systems: All other review of systems negative except as mentioned in the HPI.   Physical Exam: Vitals:   05/19/20 0847  BP: (!) 144/78  Pulse: 88   Body mass index is 38.66 kg/m. Gen:      No acute distress HEENT:  sclera anicteric Abd:      soft, non-tender; no palpable masses, no distension Ext:    No edema Neuro: alert and oriented x 3 Psych: normal mood and affect  Data Reviewed:  Reviewed labs, radiology imaging, old records and pertinent past GI work up   Assessment and Plan/Recommendations: 65 year old very pleasant female here for evaluation of positive Cologuard.  Family history positive for colon cancer and Crohn's disease  We will proceed with colonoscopy for further evaluation  Do not recommend doing cologaurd in the future for colorectal cancer screening  Further  recommendations based on findings on colonoscopy  The risks and benefits as well as alternatives of endoscopic procedure(s) have been discussed and reviewed. All questions answered. The patient agrees to proceed.    The patient was provided an opportunity to ask questions and all were answered. The patient agreed with the plan and demonstrated an understanding of the instructions.  Damaris Hippo , MD    CC: Sharion Balloon, FNP

## 2020-05-26 ENCOUNTER — Other Ambulatory Visit: Payer: Self-pay | Admitting: *Deleted

## 2020-05-26 DIAGNOSIS — E559 Vitamin D deficiency, unspecified: Secondary | ICD-10-CM

## 2020-06-02 ENCOUNTER — Ambulatory Visit (AMBULATORY_SURGERY_CENTER): Payer: Medicare Other | Admitting: Gastroenterology

## 2020-06-02 ENCOUNTER — Other Ambulatory Visit: Payer: Self-pay

## 2020-06-02 ENCOUNTER — Encounter: Payer: Self-pay | Admitting: Gastroenterology

## 2020-06-02 VITALS — BP 129/72 | HR 80 | Temp 98.9°F | Resp 13 | Ht 62.0 in | Wt 211.0 lb

## 2020-06-02 DIAGNOSIS — D127 Benign neoplasm of rectosigmoid junction: Secondary | ICD-10-CM | POA: Diagnosis not present

## 2020-06-02 DIAGNOSIS — K648 Other hemorrhoids: Secondary | ICD-10-CM

## 2020-06-02 DIAGNOSIS — K5730 Diverticulosis of large intestine without perforation or abscess without bleeding: Secondary | ICD-10-CM

## 2020-06-02 DIAGNOSIS — D128 Benign neoplasm of rectum: Secondary | ICD-10-CM | POA: Diagnosis not present

## 2020-06-02 DIAGNOSIS — D125 Benign neoplasm of sigmoid colon: Secondary | ICD-10-CM | POA: Diagnosis not present

## 2020-06-02 DIAGNOSIS — R195 Other fecal abnormalities: Secondary | ICD-10-CM

## 2020-06-02 MED ORDER — SODIUM CHLORIDE 0.9 % IV SOLN: 500.0000 mL | Freq: Once | INTRAVENOUS | Status: DC

## 2020-06-02 NOTE — Op Note (Signed)
[facility] Patient Name: Tammy Lawson Procedure Date: 06/02/2020 10:18 AM MRN: [id number] Endoscopist: Mauri Pole , MD Age: 65 Referring MD:  Date of Birth: 1955/09/24 Gender: Female Account #: [id number] Procedure:                Colonoscopy Indications:              Positive Cologuard test Medicines:                Monitored Anesthesia Care Procedure:                Pre-Anesthesia Assessment:                           - Prior to the procedure, a History and Physical                            was performed, and patient medications and                            allergies were reviewed. The patient's tolerance of                            previous anesthesia was also reviewed. The risks                            and benefits of the procedure and the sedation                            options and risks were discussed with the patient.                            All questions were answered, and informed consent                            was obtained. Prior Anticoagulants: The patient has                            taken no previous anticoagulant or antiplatelet                            agents. ASA Grade Assessment: II - A patient with                            mild systemic disease. After reviewing the risks                            and benefits, the patient was deemed in                            satisfactory condition to undergo the procedure.                           After obtaining informed consent, the colonoscope  was passed under direct vision. Throughout the                            procedure, the patient's blood pressure, pulse, and                            oxygen saturations were monitored continuously. The                            Olympus PCF-H190DL (#[id number]) Colonoscope was                            introduced through the anus and advanced to the the                            cecum, identified by appendiceal  orifice and                            ileocecal valve. The colonoscopy was performed                            without difficulty. The patient tolerated the                            procedure well. The quality of the bowel                            preparation was good. The ileocecal valve,                            appendiceal orifice, and rectum were photographed. Scope In: 10:32:46 AM Scope Out: 10:49:34 AM Scope Withdrawal Time: 0 hours 11 minutes 24 seconds  Total Procedure Duration: 0 hours 16 minutes 48 seconds  Findings:                 The perianal and digital rectal examinations were                            normal.                           Two sessile polyps were found in the rectum and                            sigmoid colon. The polyps were 4 to 6 mm in size.                            These polyps were removed with a jumbo cold                            forceps. Resection and retrieval were complete.                           Scattered small and large-mouthed diverticula were  found in the sigmoid colon, descending colon,                            transverse colon and ascending colon.                           Non-bleeding external and internal hemorrhoids were                            found during retroflexion. The hemorrhoids were                            medium-sized. Complications:            No immediate complications. Estimated Blood Loss:     Estimated blood loss was minimal. Impression:               - Two 4 to 6 mm polyps in the rectum and in the                            sigmoid colon, removed with a jumbo cold forceps.                            Resected and retrieved.                           - Diverticulosis in the sigmoid colon, in the                            descending colon, in the transverse colon and in                            the ascending colon.                           - Non-bleeding external and  internal hemorrhoids. Recommendation:           - Patient has a contact number available for                            emergencies. The signs and symptoms of potential                            delayed complications were discussed with the                            patient. Return to normal activities tomorrow.                            Written discharge instructions were provided to the                            patient.                           - Resume previous diet.                           -  Continue present medications.                           - Await pathology results.                           - Repeat colonoscopy in 5-10 years for surveillance                            based on pathology results. Napoleon Form, MD 06/02/2020 10:55:13 AM This report has been signed electronically.

## 2020-06-02 NOTE — Progress Notes (Signed)
VS by CW. ?

## 2020-06-02 NOTE — Progress Notes (Signed)
PT taken to PACU. Monitors in place. VSS. Report given to RN. 

## 2020-06-02 NOTE — Progress Notes (Signed)
Called to room to assist during endoscopic procedure.  Patient ID and intended procedure confirmed with present staff. Received instructions for my participation in the procedure from the performing physician.  

## 2020-06-02 NOTE — Patient Instructions (Addendum)
Impression/Recommendations:  Polyp, diverticulosis, and hemorrhoid handouts given to patient.  Resume previous diet. Continue present medications. Await pathology results.  Repeat colonoscopy in 5-10 years for surveillance.  Date to be determined after pathology results reviewed.  YOU HAD AN ENDOSCOPIC PROCEDURE TODAY AT THE [facility]:   Refer to the procedure report that was given to you for any specific questions about what was found during the examination.  If the procedure report does not answer your questions, please call your gastroenterologist to clarify.  If you requested that your care partner not be given the details of your procedure findings, then the procedure report has been included in a sealed envelope for you to review at your convenience later.  YOU SHOULD EXPECT: Some feelings of bloating in the abdomen. Passage of more gas than usual.  Walking can help get rid of the air that was put into your GI tract during the procedure and reduce the bloating. If you had a lower endoscopy (such as a colonoscopy or flexible sigmoidoscopy) you may notice spotting of blood in your stool or on the toilet paper. If you underwent a bowel prep for your procedure, you may not have a normal bowel movement for a few days.  Please Note:  You might notice some irritation and congestion in your nose or some drainage.  This is from the oxygen used during your procedure.  There is no need for concern and it should clear up in a day or so.  SYMPTOMS TO REPORT IMMEDIATELY:  Following lower endoscopy (colonoscopy or flexible sigmoidoscopy):  Excessive amounts of blood in the stool  Significant tenderness or worsening of abdominal pains  Swelling of the abdomen that is new, acute  Fever of 100F or higher  For urgent or emergent issues, a gastroenterologist can be reached at any hour by calling [phone]. Do not use MyChart messaging for urgent concerns.    DIET:  We do recommend  a small meal at first, but then you may proceed to your regular diet.  Drink plenty of fluids but you should avoid alcoholic beverages for 24 hours.  ACTIVITY:  You should plan to take it easy for the rest of today and you should NOT DRIVE or use heavy machinery until tomorrow (because of the sedation medicines used during the test).    FOLLOW UP: Our staff will call the number listed on your records 48-72 hours following your procedure to check on you and address any questions or concerns that you may have regarding the information given to you following your procedure. If we do not reach you, we will leave a message.  We will attempt to reach you two times.  During this call, we will ask if you have developed any symptoms of COVID 19. If you develop any symptoms (ie: fever, flu-like symptoms, shortness of breath, cough etc.) before then, please call [phone].  If you test positive for Covid 19 in the 2 weeks post procedure, please call and report this information to us.    If any biopsies were taken you will be contacted by phone or by letter within the next 1-3 weeks.  Please call us at [phone] if you have not heard about the biopsies in 3 weeks.    SIGNATURES/CONFIDENTIALITY: You and/or your care partner have signed paperwork which will be entered into your electronic medical record.  These signatures attest to the fact that that the information above on your After Visit Summary has been reviewed and   been reviewed and is understood.  Full responsibility of the confidentiality of this discharge information lies with you and/or your care-partner.

## 2020-06-06 ENCOUNTER — Telehealth: Payer: Self-pay | Admitting: *Deleted

## 2020-06-06 NOTE — Telephone Encounter (Signed)
  Follow up Call-  Call back number 06/02/2020  Post procedure Call Back phone  # [phone]  Permission to leave phone message Yes  Some recent data might be hidden     Patient questions:  Do you have a fever, pain , or abdominal swelling? No. Pain Score  0 *  Have you tolerated food without any problems? Yes.    Have you been able to return to your normal activities? Yes.    Do you have any questions about your discharge instructions: Diet   No. Medications  No. Follow up visit  No.  Do you have questions or concerns about your Care? No.  Actions: * If pain score is 4 or above: 1. No action needed, pain <4.Have you developed a fever since your procedure? no  2.   Have you had an respiratory symptoms (SOB or cough) since your procedure? no  3.   Have you tested positive for COVID 19 since your procedure no  4.   Have you had any family members/close contacts diagnosed with the COVID 19 since your procedure?  no   If yes to any of these questions please route to Joylene John, RN and Joella Prince, RN

## 2020-06-20 ENCOUNTER — Encounter: Payer: Self-pay | Admitting: Gastroenterology

## 2020-06-30 ENCOUNTER — Encounter: Payer: Self-pay | Admitting: Family Medicine

## 2020-06-30 ENCOUNTER — Ambulatory Visit (INDEPENDENT_AMBULATORY_CARE_PROVIDER_SITE_OTHER): Payer: Medicare Other | Admitting: Family Medicine

## 2020-06-30 DIAGNOSIS — J0100 Acute maxillary sinusitis, unspecified: Secondary | ICD-10-CM | POA: Diagnosis not present

## 2020-06-30 MED ORDER — DOXYCYCLINE HYCLATE 100 MG PO TABS: 100.0000 mg | ORAL_TABLET | Freq: Two times a day (BID) | ORAL | 0 refills | Status: AC

## 2020-06-30 MED ORDER — FLUTICASONE PROPIONATE 50 MCG/ACT NA SUSP: 2.0000 | Freq: Every day | NASAL | 6 refills | Status: DC

## 2020-06-30 NOTE — Progress Notes (Signed)
   Virtual Visit via telephone Note Due to COVID-19 pandemic this visit was conducted virtually. This visit type was conducted due to national recommendations for restrictions regarding the COVID-19 Pandemic (e.g. social distancing, sheltering in place) in an effort to limit this patient's exposure and mitigate transmission in our community. All issues noted in this document were discussed and addressed.  A physical exam was not performed with this format.  I connected with Tammy Lawson on 06/30/20 at 1251 by telephone and verified that I am speaking with the correct person using two identifiers. Tammy Lawson is currently located at home and no one is currently with her during the visit. The provider, Gwenlyn Perking, FNP is located in their office at time of visit.  I discussed the limitations, risks, security and privacy concerns of performing an evaluation and management service by telephone and the availability of in person appointments. I also discussed with the patient that there may be a patient responsible charge related to this service. The patient expressed understanding and agreed to proceed.  CC: Sinusitis  History and Present Illness:  HPI  [name] reports head congestion for 12 days. She started having Covid symptoms and tested positive for Covid 12 days ago. She has been using Sudafed PE and doing a sinus wash however her congestion has not improved. She also reports a headache, ear congestion and postnasal drip. Her face is tender around her cheeks. She denies fever, chills, nausea, vomiting, chest pain, or shortness of breath.    ROS As per HPI.   Observations/Objective: Alert and oriented x 3. Able to speak in full sentences without difficulty.   Assessment and Plan: [name] was seen today for sinusitis.  Diagnoses and all orders for this visit:  Acute non-recurrent maxillary sinusitis Doxycycline ordered as patient is allergic to Augmentin. Start flonase daily for  postnasal drip. Continue sinus wash and decongestants. Stay well hydrated. Return to office for new or worsening symptoms, or if symptoms persist.  -     doxycycline (VIBRA-TABS) 100 MG tablet; Take 1 tablet (100 mg total) by mouth 2 (two) times daily for 10 days. 1 po bid -     fluticasone (FLONASE) 50 MCG/ACT nasal spray; Place 2 sprays into both nostrils daily.    Follow Up Instructions: As needed.     I discussed the assessment and treatment plan with the patient. The patient was provided an opportunity to ask questions and all were answered. The patient agreed with the plan and demonstrated an understanding of the instructions.   The patient was advised to call back or seek an in-person evaluation if the symptoms worsen or if the condition fails to improve as anticipated.  The above assessment and management plan was discussed with the patient. The patient verbalized understanding of and has agreed to the management plan. Patient is aware to call the clinic if symptoms persist or worsen. Patient is aware when to return to the clinic for a follow-up visit. Patient educated on when it is appropriate to go to the emergency department.   Time call ended: 1259  I provided 8 minutes of phone time during this encounter.    Gwenlyn Perking, FNP

## 2020-07-23 ENCOUNTER — Other Ambulatory Visit: Payer: Self-pay | Admitting: Family

## 2020-07-23 DIAGNOSIS — I10 Essential (primary) hypertension: Secondary | ICD-10-CM

## 2020-07-24 ENCOUNTER — Ambulatory Visit: Payer: Medicare Other | Admitting: Family

## 2020-08-08 ENCOUNTER — Other Ambulatory Visit: Payer: Self-pay

## 2020-08-08 ENCOUNTER — Ambulatory Visit (INDEPENDENT_AMBULATORY_CARE_PROVIDER_SITE_OTHER): Payer: Medicare Other | Admitting: Family

## 2020-08-08 ENCOUNTER — Encounter: Payer: Self-pay | Admitting: Family

## 2020-08-08 VITALS — BP 137/72 | HR 79 | Temp 97.4°F | Ht 62.0 in | Wt 210.0 lb

## 2020-08-08 DIAGNOSIS — E7800 Pure hypercholesterolemia, unspecified: Secondary | ICD-10-CM

## 2020-08-08 DIAGNOSIS — E559 Vitamin D deficiency, unspecified: Secondary | ICD-10-CM

## 2020-08-08 DIAGNOSIS — I10 Essential (primary) hypertension: Secondary | ICD-10-CM

## 2020-08-08 DIAGNOSIS — Z114 Encounter for screening for human immunodeficiency virus [HIV]: Secondary | ICD-10-CM

## 2020-08-08 MED ORDER — VITAMIN D (ERGOCALCIFEROL) 1.25 MG (50000 UNIT) PO CAPS: ORAL_CAPSULE | ORAL | 1 refills | Status: DC

## 2020-08-08 MED ORDER — LOSARTAN POTASSIUM 50 MG PO TABS: 50.0000 mg | ORAL_TABLET | Freq: Every day | ORAL | 1 refills | Status: DC

## 2020-08-08 MED ORDER — ROSUVASTATIN CALCIUM 10 MG PO TABS
ORAL_TABLET | ORAL | 1 refills | Status: DC
Start: 2020-08-08 — End: 2021-06-29

## 2020-08-08 NOTE — Patient Instructions (Signed)

## 2020-08-08 NOTE — Progress Notes (Signed)
Subjective:    Patient ID: Tammy Lawson, female    DOB: [birth year], 65 y.o.   MRN: [id number]  Chief Complaint  Patient presents with  . Medical Management of Chronic Issues  . Hypertension  . Allergic Rhinitis   . Ear Fullness    States like its popping    Pt presents to the office today for chronic follow up. She reports she had COVID 06/18/20 and has had bilateral ear pressure since. This has improved over the last week. She has been taking Flonase daily.  Hypertension This is a chronic problem. The current episode started more than 1 year ago. The problem has been resolved since onset. The problem is controlled. Pertinent negatives include no malaise/fatigue, peripheral edema or shortness of breath. Risk factors for coronary artery disease include dyslipidemia, obesity and sedentary lifestyle. The current treatment provides moderate improvement. There is no history of CVA or heart failure.  Ear Fullness  There is pain in both ears. This is a new problem. The current episode started more than 1 month ago. There has been no fever. The pain is mild.  Hyperlipidemia This is a chronic problem. The current episode started more than 1 year ago. The problem is controlled. Recent lipid tests were reviewed and are normal. Exacerbating diseases include obesity. Pertinent negatives include no shortness of breath. Current antihyperlipidemic treatment includes statins. The current treatment provides moderate improvement of lipids. Risk factors for coronary artery disease include dyslipidemia, hypertension and a sedentary lifestyle.      Review of Systems  Constitutional: Negative for malaise/fatigue.  Respiratory: Negative for shortness of breath.   All other systems reviewed and are negative.      Objective:   Physical Exam Vitals reviewed.  Constitutional:      General: She is not in acute distress.    Appearance: She is well-developed. She is obese.  HENT:     Head: Normocephalic and  atraumatic.     Right Ear: Tympanic membrane normal.     Left Ear: Tympanic membrane normal.  Eyes:     Pupils: Pupils are equal, round, and reactive to light.  Neck:     Thyroid: No thyromegaly.  Cardiovascular:     Rate and Rhythm: Normal rate and regular rhythm.     Heart sounds: Normal heart sounds. No murmur heard.   Pulmonary:     Effort: Pulmonary effort is normal. No respiratory distress.     Breath sounds: Normal breath sounds. No wheezing.  Abdominal:     General: Bowel sounds are normal. There is no distension.     Palpations: Abdomen is soft.     Tenderness: There is no abdominal tenderness.  Musculoskeletal:        General: No tenderness. Normal range of motion.     Cervical back: Normal range of motion and neck supple.  Skin:    General: Skin is warm and dry.  Neurological:     Mental Status: She is alert and oriented to person, place, and time.     Cranial Nerves: No cranial nerve deficit.     Deep Tendon Reflexes: Reflexes are normal and symmetric.  Psychiatric:        Behavior: Behavior normal.        Thought Content: Thought content normal.        Judgment: Judgment normal.       BP 137/72   Pulse 79   Temp (!) 97.4 F (36.3 C) (Temporal)   Ht _0  (1.575  m)   Wt 210 lb (95.3 kg)   BMI 38.41 kg/m      Assessment & Plan:  [name] comes in today with chief complaint of Medical Management of Chronic Issues, Hypertension, Allergic Rhinitis , and Ear Fullness (States like its popping/)   Diagnosis and orders addressed:  1. Pure hypercholesterolemia - rosuvastatin (CRESTOR) 10 MG tablet; TAKE 1 TABLET(10 MG) BY MOUTH DAILY  Dispense: 90 tablet; Refill: 1 - CBC with Differential/Platelet - CMP14+EGFR - Lipid panel  2. Vitamin D deficiency - Vitamin D, Ergocalciferol, (DRISDOL) 1.25 MG (50000 UNIT) CAPS capsule; TAKE 1 CAPSULE BY MOUTH EVERY 7 DAYS  Dispense: 12 capsule; Refill: 1 - CBC with Differential/Platelet - CMP14+EGFR - VITAMIN D  25 Hydroxy (Vit-D Deficiency, Fractures)  3. Essential hypertension - losartan (COZAAR) 50 MG tablet; Take 1 tablet (50 mg total) by mouth daily.  Dispense: 90 tablet; Refill: 1 - CBC with Differential/Platelet - CMP14+EGFR  4. Morbid obesity ([facility]) - CBC with Differential/Platelet - CMP14+EGFR  5. Encounter for screening for HIV - HIV Antibody (routine testing w rflx)   Labs pending Health Maintenance reviewed Diet and exercise encouraged  Follow up plan: 6 months    Evelina Dun, FNP

## 2020-08-09 LAB — CMP14+EGFR
ALT: 21 IU/L (ref 0–32)
AST: 23 IU/L (ref 0–40)
Albumin/Globulin Ratio: 1.9 (ref 1.2–2.2)
Albumin: 4.8 g/dL (ref 3.8–4.8)
Alkaline Phosphatase: 80 IU/L (ref 44–121)
BUN/Creatinine Ratio: 15 (ref 12–28)
BUN: 12 mg/dL (ref 8–27)
Bilirubin Total: 0.7 mg/dL (ref 0.0–1.2)
CO2: 25 mmol/L (ref 20–29)
Calcium: 9.8 mg/dL (ref 8.7–10.3)
Chloride: 103 mmol/L (ref 96–106)
Creatinine, Ser: 0.78 mg/dL (ref 0.57–1.00)
Globulin, Total: 2.5 g/dL (ref 1.5–4.5)
Glucose: 114 mg/dL — ABNORMAL HIGH (ref 65–99)
Potassium: 5.1 mmol/L (ref 3.5–5.2)
Sodium: 145 mmol/L — ABNORMAL HIGH (ref 134–144)
Total Protein: 7.3 g/dL (ref 6.0–8.5)
eGFR: 84 mL/min/{1.73_m2} (ref >59)

## 2020-08-09 LAB — CBC WITH DIFFERENTIAL/PLATELET
Basophils Absolute: 0.1 10*3/uL (ref 0.0–0.2)
Basos: 1 %
EOS (ABSOLUTE): 0.1 10*3/uL (ref 0.0–0.4)
Eos: 2 %
Hematocrit: 43.6 % (ref 34.0–46.6)
Hemoglobin: 14.2 g/dL (ref 11.1–15.9)
Immature Grans (Abs): 0 10*3/uL (ref 0.0–0.1)
Immature Granulocytes: 0 %
Lymphocytes Absolute: 1.5 10*3/uL (ref 0.7–3.1)
Lymphs: 24 %
MCH: 29.8 pg (ref 26.6–33.0)
MCHC: 32.6 g/dL (ref 31.5–35.7)
MCV: 92 fL (ref 79–97)
Monocytes Absolute: 0.4 10*3/uL (ref 0.1–0.9)
Monocytes: 7 %
Neutrophils Absolute: 4.1 10*3/uL (ref 1.4–7.0)
Neutrophils: 66 %
Platelets: 228 10*3/uL (ref 150–450)
RBC: 4.76 x10E6/uL (ref 3.77–5.28)
RDW: 13.3 % (ref 11.7–15.4)
WBC: 6.2 10*3/uL (ref 3.4–10.8)

## 2020-08-09 LAB — VITAMIN D 25 HYDROXY (VIT D DEFICIENCY, FRACTURES): Vit D, 25-Hydroxy: 54.6 ng/mL (ref 30.0–100.0)

## 2020-08-09 LAB — LIPID PANEL
Chol/HDL Ratio: 3 ratio (ref 0.0–4.4)
Cholesterol, Total: 129 mg/dL (ref 100–199)
HDL: 43 mg/dL (ref >39)
LDL Chol Calc (NIH): 65 mg/dL (ref 0–99)
Triglycerides: 117 mg/dL (ref 0–149)
VLDL Cholesterol Cal: 21 mg/dL (ref 5–40)

## 2020-08-09 LAB — HIV ANTIBODY (ROUTINE TESTING W REFLEX): HIV Screen 4th Generation wRfx: NONREACTIVE

## 2020-09-07 ENCOUNTER — Encounter: Payer: Self-pay | Admitting: Family

## 2020-09-07 ENCOUNTER — Other Ambulatory Visit: Payer: Self-pay

## 2020-09-07 ENCOUNTER — Ambulatory Visit (INDEPENDENT_AMBULATORY_CARE_PROVIDER_SITE_OTHER): Payer: Medicare Other | Admitting: Family

## 2020-09-07 VITALS — BP 147/76 | HR 81 | Temp 99.0°F | Ht 62.0 in | Wt 208.2 lb

## 2020-09-07 DIAGNOSIS — R002 Palpitations: Secondary | ICD-10-CM

## 2020-09-07 DIAGNOSIS — Z23 Encounter for immunization: Secondary | ICD-10-CM | POA: Diagnosis not present

## 2020-09-07 DIAGNOSIS — Z0000 Encounter for general adult medical examination without abnormal findings: Secondary | ICD-10-CM

## 2020-09-07 DIAGNOSIS — Z0001 Encounter for general adult medical examination with abnormal findings: Secondary | ICD-10-CM | POA: Diagnosis not present

## 2020-09-07 NOTE — Progress Notes (Signed)
Subjective:    Tammy Lawson is a 65 y.o. female who presents for a Welcome to Medicare exam.    She reports she is having palpitations and anxiety over the last two weeks. Denies any SOB, edema, or chest pain.   Review of Systems Reports palpitations  Cardiac Risk Factors include: advanced age (>25men, >64 women);obesity (BMI >30kg/m2);hypertension;dyslipidemia      Objective:    Today's Vitals   09/07/20 0917 09/07/20 0921  BP: (!) 151/67 (!) 147/76  Pulse: 81   Temp: 99 F (37.2 C)   SpO2: 99%   Weight: 208 lb 3.2 oz (94.4 kg)   Height: 5\' 2"  (1.575 m)   Body mass index is 38.08 kg/m.  Medications Outpatient Encounter Medications as of 09/07/2020  Medication Sig  . Ascorbic Acid (VITAMIN C) 100 MG tablet Take 100 mg by mouth daily.  Marland Kitchen CALCIUM-MAGNESIUM-ZINC PO Take 1 capsule by mouth once a week.  . fluticasone (FLONASE) 50 MCG/ACT nasal spray Place 2 sprays into both nostrils daily.  Marland Kitchen losartan (COZAAR) 50 MG tablet Take 1 tablet (50 mg total) by mouth daily.  . rosuvastatin (CRESTOR) 10 MG tablet TAKE 1 TABLET(10 MG) BY MOUTH DAILY  . Vitamin D, Ergocalciferol, (DRISDOL) 1.25 MG (50000 UNIT) CAPS capsule TAKE 1 CAPSULE BY MOUTH EVERY 7 DAYS   No facility-administered encounter medications on file as of 09/07/2020.     History: Past Medical History:  Diagnosis Date  . Cataract   . HLD (hyperlipidemia)   . HTN (hypertension)   . Osteopenia   . Vitamin D deficiency    Past Surgical History:  Procedure Laterality Date  . NO PAST SURGERIES      Family History  Problem Relation Age of Onset  . Diabetes Mother   . Heart disease Mother   . Stroke Mother   . Hypertension Mother   . Hyperlipidemia Mother   . COPD Father   . Rectal cancer Father   . Colon polyps Father   . Atrial fibrillation Father   . Hyperlipidemia Father   . Prostate cancer Father   . Lung disease Father        tumor on lung  . Cancer Sister        Mets and don't know origin  .  Diabetes Paternal Grandmother   . Albinism Sister   . Pulmonary fibrosis Sister   . Other Sister        Hermansky pudlac syndrome  . Diabetes Son   . Hypertension Son   . Hyperlipidemia Son   . Esophageal cancer Neg Hx   . Stomach cancer Neg Hx    Social History   Occupational History  . Occupation: retired  Tobacco Use  . Smoking status: Never Smoker  . Smokeless tobacco: Never Used  Vaping Use  . Vaping Use: Never used  Substance and Sexual Activity  . Alcohol use: No  . Drug use: No  . Sexual activity: Yes    Tobacco Counseling Counseling given: Not Answered   Immunizations and Health Maintenance Immunization History  Administered Date(s) Administered  . PFIZER(Purple Top)SARS-COV-2 Vaccination 11/08/2019, 11/29/2019  . Tdap 01/20/2019   There are no preventive care reminders to display for this patient.  Activities of Daily Living In your present state of health, do you have any difficulty performing the following activities: 09/07/2020  Hearing? N  Vision? N  Comment wears contacts  Difficulty concentrating or making decisions? N  Walking or climbing stairs? N  Dressing or bathing?  N  Doing errands, shopping? N  Preparing Food and eating ? N  Using the Toilet? N  In the past six months, have you accidently leaked urine? Y  Comment cough or sneeze- started when had first uti  Do you have problems with loss of bowel control? N  Managing your Medications? N  Managing your Finances? N  Housekeeping or managing your Housekeeping? N  Some recent data might be hidden    Physical Exam   or other factors deemed appropriate based on the beneficiary's medical and social history and current clinical standards.  Advanced Directives: Does Patient Have a Medical Advance Directive?: No Would patient like information on creating a medical advance directive?: No - Patient declined    Assessment:    This is a routine wellness examination for this patient  .  Vision/Hearing screen No exam data present  Dietary issues and exercise activities discussed:  Type of exercise: walking, Time (Minutes): 30, Frequency (Times/Week): 5, Weekly Exercise (Minutes/Week): 150, Intensity: Mild, Exercise limited by: Other - see comments (blood pressure)  Goals   None    Depression Screen PHQ 2/9 Scores 09/07/2020 08/08/2020 01/25/2020 07/22/2019  PHQ - 2 Score 0 0 0 0     Fall Risk Fall Risk  09/07/2020  Falls in the past year? 0    Cognitive Function: MMSE - Mini Mental State Exam 09/07/2020  Orientation to time 5  Orientation to Place 2  Registration 3  Attention/ Calculation 5  Recall 3  Language- name 2 objects 2  Language- repeat 1  Language- follow 3 step command 3  Language- read & follow direction 1  Write a sentence 1  Copy design 1  Total score 27        Patient Care Team: Sharion Balloon, FNP as PCP - General (Family Medicine)     Plan:     I have personally reviewed and noted the following in the patient's chart:   . Medical and social history . Use of alcohol, tobacco or illicit drugs  . Current medications and supplements . Functional ability and status . Nutritional status . Physical activity . Advanced directives . List of other physicians . Hospitalizations, surgeries, and ER visits in previous 12 months . Vitals . Screenings to include cognitive, depression, and falls . Referrals and appointments  In addition, I have reviewed and discussed with patient certain preventive protocols, quality metrics, and best practice recommendations. A written personalized care plan for preventive services as well as general preventive health recommendations were provided to patient.    1. Encounter for Medicare annual wellness exam  2. Palpitations Ectopic beats Limit caffeine and stress - EKG 12-Lead  3. Morbid obesity [facility])   Evelina Dun, [place] 09/07/2020

## 2020-09-07 NOTE — Patient Instructions (Addendum)

## 2021-01-31 DIAGNOSIS — H2513 Age-related nuclear cataract, bilateral: Secondary | ICD-10-CM | POA: Diagnosis not present

## 2021-02-04 ENCOUNTER — Other Ambulatory Visit: Payer: Self-pay | Admitting: Family

## 2021-02-04 DIAGNOSIS — E559 Vitamin D deficiency, unspecified: Secondary | ICD-10-CM

## 2021-02-08 ENCOUNTER — Encounter: Payer: Self-pay | Admitting: Family

## 2021-02-08 ENCOUNTER — Ambulatory Visit (INDEPENDENT_AMBULATORY_CARE_PROVIDER_SITE_OTHER): Payer: Medicare Other | Admitting: Family

## 2021-02-08 ENCOUNTER — Other Ambulatory Visit: Payer: Self-pay

## 2021-02-08 VITALS — BP 138/78 | HR 81 | Temp 95.6°F | Ht 62.0 in | Wt 211.2 lb

## 2021-02-08 DIAGNOSIS — R7303 Prediabetes: Secondary | ICD-10-CM

## 2021-02-08 DIAGNOSIS — Z23 Encounter for immunization: Secondary | ICD-10-CM

## 2021-02-08 DIAGNOSIS — E7800 Pure hypercholesterolemia, unspecified: Secondary | ICD-10-CM | POA: Diagnosis not present

## 2021-02-08 DIAGNOSIS — I10 Essential (primary) hypertension: Secondary | ICD-10-CM

## 2021-02-08 DIAGNOSIS — E559 Vitamin D deficiency, unspecified: Secondary | ICD-10-CM

## 2021-02-08 LAB — BAYER DCA HB A1C WAIVED: HB A1C (BAYER DCA - WAIVED): 5.9 % — ABNORMAL HIGH (ref 4.8–5.6)

## 2021-02-08 LAB — CMP14+EGFR
ALT: 16 IU/L (ref 0–32)
AST: 19 IU/L (ref 0–40)
Albumin/Globulin Ratio: 2 (ref 1.2–2.2)
Albumin: 4.5 g/dL (ref 3.8–4.8)
Alkaline Phosphatase: 71 IU/L (ref 44–121)
BUN/Creatinine Ratio: 20 (ref 12–28)
BUN: 17 mg/dL (ref 8–27)
Bilirubin Total: 0.6 mg/dL (ref 0.0–1.2)
CO2: 26 mmol/L (ref 20–29)
Calcium: 9.5 mg/dL (ref 8.7–10.3)
Chloride: 103 mmol/L (ref 96–106)
Creatinine, Ser: 0.83 mg/dL (ref 0.57–1.00)
Globulin, Total: 2.3 g/dL (ref 1.5–4.5)
Glucose: 110 mg/dL — ABNORMAL HIGH (ref 70–99)
Potassium: 4.7 mmol/L (ref 3.5–5.2)
Sodium: 142 mmol/L (ref 134–144)
Total Protein: 6.8 g/dL (ref 6.0–8.5)
eGFR: 78 mL/min/{1.73_m2} (ref >59)

## 2021-02-08 NOTE — Progress Notes (Signed)
Subjective:    Patient ID: Tammy Lawson, female    DOB: 02-05-1956, 65 y.o.   MRN: [id number]  Chief Complaint  Patient presents with   Medical Management of Chronic Issues   PT presents to the office today for chronic follow up. She is considered morbid obese with a BMI of 38 and co morbility of HTN.   Hypertension This is a chronic problem. The current episode started more than 1 year ago. The problem has been resolved since onset. The problem is controlled. Pertinent negatives include no blurred vision, malaise/fatigue, peripheral edema or shortness of breath. Risk factors for coronary artery disease include dyslipidemia and obesity. The current treatment provides moderate improvement.  Hyperlipidemia This is a chronic problem. The current episode started more than 1 year ago. Exacerbating diseases include obesity. Pertinent negatives include no shortness of breath. Current antihyperlipidemic treatment includes statins. The current treatment provides moderate improvement of lipids.  Diabetes She presents for her follow-up diabetic visit. She has type 2 diabetes mellitus. Pertinent negatives for diabetes include no blurred vision and no foot paresthesias. Symptoms are stable. Risk factors for coronary artery disease include dyslipidemia, hypertension and sedentary lifestyle. Her overall blood glucose range is 110-130 mg/dl. Eye exam is current.     Review of Systems  Constitutional:  Negative for malaise/fatigue.  Eyes:  Negative for blurred vision.  Respiratory:  Negative for shortness of breath.   All other systems reviewed and are negative.     Objective:   Physical Exam Vitals reviewed.  Constitutional:      General: She is not in acute distress.    Appearance: She is well-developed.  HENT:     Head: Normocephalic and atraumatic.     Right Ear: Tympanic membrane normal.     Left Ear: Tympanic membrane normal.  Eyes:     Pupils: Pupils are equal, round, and reactive to  light.  Neck:     Thyroid: No thyromegaly.  Cardiovascular:     Rate and Rhythm: Normal rate and regular rhythm.     Heart sounds: Normal heart sounds. No murmur heard. Pulmonary:     Effort: Pulmonary effort is normal. No respiratory distress.     Breath sounds: Normal breath sounds. No wheezing.  Abdominal:     General: Bowel sounds are normal. There is no distension.     Palpations: Abdomen is soft.     Tenderness: There is no abdominal tenderness.  Musculoskeletal:        General: No tenderness. Normal range of motion.     Cervical back: Normal range of motion and neck supple.  Skin:    General: Skin is warm and dry.  Neurological:     Mental Status: She is alert and oriented to person, place, and time.     Cranial Nerves: No cranial nerve deficit.     Deep Tendon Reflexes: Reflexes are normal and symmetric.  Psychiatric:        Behavior: Behavior normal.        Thought Content: Thought content normal.        Judgment: Judgment normal.      BP 138/78   Pulse 81   Temp (!) 95.6 F (35.3 C) (Temporal)   Ht 5' 2"  (1.575 m)   Wt 211 lb 3.2 oz (95.8 kg)   BMI 38.63 kg/m      Assessment & Plan:  Tammy Lawson comes in today with chief complaint of Medical Management of Chronic Issues   Diagnosis and  orders addressed:  1. Need for immunization against influenza - Flu Vaccine QUAD High Dose(Fluad) - CMP14+EGFR  2. Essential hypertension - CMP14+EGFR  3. Morbid obesity ([place]) - CMP14+EGFR  4. Pure hypercholesterolemia - CMP14+EGFR  5. Vitamin D deficiency - CMP14+EGFR  6. Prediabetes - CMP14+EGFR - Bayer DCA Hb A1c Waived   Labs pending Health Maintenance reviewed Diet and exercise encouraged  Follow up plan: 6 months    Evelina Dun, FNP

## 2021-02-08 NOTE — Patient Instructions (Signed)

## 2021-02-17 ENCOUNTER — Other Ambulatory Visit: Payer: Self-pay | Admitting: Family

## 2021-02-17 DIAGNOSIS — Z1231 Encounter for screening mammogram for malignant neoplasm of breast: Secondary | ICD-10-CM

## 2021-04-04 ENCOUNTER — Other Ambulatory Visit: Payer: Self-pay

## 2021-04-04 ENCOUNTER — Ambulatory Visit
Admission: RE | Admit: 2021-04-04 | Discharge: 2021-04-04 | Disposition: A | Discharge status: Home / Self Care | Payer: Medicare Other | Source: Ambulatory Visit | Attending: Family | Admitting: Family

## 2021-04-04 DIAGNOSIS — Z1231 Encounter for screening mammogram for malignant neoplasm of breast: Secondary | ICD-10-CM | POA: Diagnosis not present

## 2021-04-13 ENCOUNTER — Other Ambulatory Visit: Payer: Self-pay | Admitting: Family

## 2021-04-13 DIAGNOSIS — I10 Essential (primary) hypertension: Secondary | ICD-10-CM

## 2021-05-03 ENCOUNTER — Other Ambulatory Visit: Payer: Self-pay | Admitting: Family Medicine

## 2021-05-03 DIAGNOSIS — J0100 Acute maxillary sinusitis, unspecified: Secondary | ICD-10-CM

## 2021-06-29 ENCOUNTER — Other Ambulatory Visit: Payer: Self-pay | Admitting: Family

## 2021-06-29 DIAGNOSIS — E7800 Pure hypercholesterolemia, unspecified: Secondary | ICD-10-CM

## 2021-07-30 ENCOUNTER — Other Ambulatory Visit: Payer: Self-pay | Admitting: Family

## 2021-07-30 DIAGNOSIS — E559 Vitamin D deficiency, unspecified: Secondary | ICD-10-CM

## 2021-08-09 ENCOUNTER — Ambulatory Visit (INDEPENDENT_AMBULATORY_CARE_PROVIDER_SITE_OTHER): Payer: Medicare Other | Admitting: Family

## 2021-08-09 ENCOUNTER — Other Ambulatory Visit (HOSPITAL_COMMUNITY)
Admission: RE | Admit: 2021-08-09 | Discharge: 2021-08-09 | Disposition: A | Discharge status: Home / Self Care | Payer: Medicare Other | Source: Ambulatory Visit | Attending: Family | Admitting: Family

## 2021-08-09 ENCOUNTER — Ambulatory Visit: Payer: Medicare Other | Admitting: Family

## 2021-08-09 ENCOUNTER — Encounter: Payer: Self-pay | Admitting: Family

## 2021-08-09 VITALS — BP 131/76 | HR 82 | Temp 97.6°F | Ht 62.0 in | Wt 201.6 lb

## 2021-08-09 DIAGNOSIS — E7800 Pure hypercholesterolemia, unspecified: Secondary | ICD-10-CM | POA: Diagnosis not present

## 2021-08-09 DIAGNOSIS — R7303 Prediabetes: Secondary | ICD-10-CM | POA: Diagnosis not present

## 2021-08-09 DIAGNOSIS — I10 Essential (primary) hypertension: Secondary | ICD-10-CM | POA: Diagnosis not present

## 2021-08-09 DIAGNOSIS — Z0001 Encounter for general adult medical examination with abnormal findings: Secondary | ICD-10-CM

## 2021-08-09 DIAGNOSIS — Z0000 Encounter for general adult medical examination without abnormal findings: Secondary | ICD-10-CM | POA: Diagnosis not present

## 2021-08-09 DIAGNOSIS — Z01411 Encounter for gynecological examination (general) (routine) with abnormal findings: Secondary | ICD-10-CM | POA: Insufficient documentation

## 2021-08-09 DIAGNOSIS — L900 Lichen sclerosus et atrophicus: Secondary | ICD-10-CM

## 2021-08-09 DIAGNOSIS — E559 Vitamin D deficiency, unspecified: Secondary | ICD-10-CM

## 2021-08-09 MED ORDER — CLOBETASOL PROPIONATE 0.05 % EX CREA: 1.0000 "application " | TOPICAL_CREAM | Freq: Two times a day (BID) | CUTANEOUS | 2 refills | Status: DC

## 2021-08-09 NOTE — Patient Instructions (Signed)
Lichen Sclerosus ?Lichen sclerosus is a skin problem. It can happen on any part of the body, but it commonly involves the anal and genital areas. It can cause itching and discomfort in these areas. Treatment can help to control symptoms. When the genital area is affected, getting treatment is important because the condition can cause scarring that may lead to other problems if left untreated. ?What are the causes? ?The cause of this condition is not known. It may be related to an overactive immune system or a lack of certain hormones. Lichen sclerosus is not an infection or a fungus, and it is not passed from one person to another (non-contagious). ?What increases the risk? ?The following factors may make you more likely to develop this condition: ?You are a woman who has reached menopause. ?You are a man who was not circumcised. ?This condition may also develop for the first time in children, usually before they enter puberty. ?What are the signs or symptoms? ?Symptoms of this condition include: ?White areas (plaques) on the skin that may be thin and wrinkled, or thickened. ?Red and swollen patches (lesions) on the skin. ?Tears or cracks in the skin. ?Bruising. ?Blood blisters. ?Severe itching. ?Pain, itching, or burning when urinating. ?Constipation is also common in children with lichen sclerosus, but can be seen in adults. ?How is this diagnosed? ?This condition may be diagnosed with a physical exam. In some cases, a tissue sample may be removed to be checked under a microscope (biopsy). ?How is this treated? ?This condition may be treated with: ?Topical steroids. These are medicated creams or ointments that are applied over the affected areas. ?Medicines that are taken by mouth. ?Topical immunotherapy. These are medicated creams or ointments that are applied over the affected areas. They stimulate your immune system to fight the skin condition. This may be used if steroids are not effective. ?Surgery. This may  be needed in more severe cases that are causing problems such as scarring. ?Follow these instructions at home: ?Medicines ?Take over-the-counter and prescription medicines only as told by your health care provider. ?Use creams or ointments as told by your health care provider. ?Skin care ?Do not scratch the affected areas of skin. ?If you are a woman, be sure to keep the vaginal area as clean and dry as possible. ?Clean the affected area of skin gently with water only. Pat skin dry and avoid the use of rough towels or toilet paper. ?Avoid irritating skin products, including soap and scented lotions. Use emollient creams as directed by your health care provider to help reduce itching. ?General instructions ?Keep all follow-up visits. This is important. ?Your condition may cause constipation. To prevent or treat constipation, you may need to: ?Drink enough fluid to keep your urine pale yellow. ?Take over-the-counter or prescription medicines. ?Eat foods that are high in fiber, such as beans, whole grains, and fresh fruits and vegetables. ?Limit foods that are high in fat and processed sugars, such as fried or sweet foods. ?Contact a health care provider if: ?You have increasing redness, swelling, or pain in the affected area. ?You have fluid, blood, or pus coming from the affected area. ?You have new lesions on your skin. ?You have a fever. ?You have pain during sex. ?Get help right away if: ?You develop severe pain or burning in the affected areas, especially in the genital area. ?Summary ?Lichen sclerosus is a skin problem. When the genital area is affected, getting treatment is important because the condition can cause scarring that may  lead to other problems if left untreated. ?This condition is usually treated with medicated creams or ointments (topical steroids) that are applied over the affected areas. ?Take or use over-the-counter and prescription medicines only as told by your health care provider. ?Contact a  health care provider if you have new lesions on your skin, have pain during sex, or have increasing redness, swelling, or pain in the affected area. ?Keep all follow-up visits. This is important. ?This information is not intended to replace advice given to you by your health care provider. Make sure you discuss any questions you have with your health care provider. ?Document Revised: 09/04/2019 Document Reviewed: 09/04/2019 ?Elsevier Patient Education ? [place]. ? ?

## 2021-08-09 NOTE — Progress Notes (Signed)
? ?Subjective:  ? ? Patient ID: Tammy Lawson, female    DOB: July 06, 1955, 66 y.o.   MRN: [id number] ? ?Chief Complaint  ?Patient presents with  ? Annual Exam  ? ?PT presents to the office today for CPE with pap She is considered morbid obese with a BMI of 36 and co morbility of HTN.   ?Hypertension ?This is a chronic problem. The current episode started more than 1 year ago. The problem has been resolved since onset. The problem is controlled. Pertinent negatives include no blurred vision, malaise/fatigue, peripheral edema or shortness of breath. Risk factors for coronary artery disease include dyslipidemia, obesity and sedentary lifestyle. The current treatment provides moderate improvement.  ?Hyperlipidemia ?This is a chronic problem. The current episode started more than 1 year ago. The problem is controlled. Exacerbating diseases include obesity. Pertinent negatives include no shortness of breath. Current antihyperlipidemic treatment includes statins. The current treatment provides moderate improvement of lipids. Risk factors for coronary artery disease include dyslipidemia, hypertension and a sedentary lifestyle.  ?Diabetes ?She presents for her follow-up diabetic visit. She has type 2 diabetes mellitus. There are no hypoglycemic associated symptoms. Pertinent negatives for diabetes include no blurred vision and no foot paresthesias. Symptoms are stable. Pertinent negatives for diabetic complications include no heart disease, nephropathy or peripheral neuropathy. Risk factors for coronary artery disease include dyslipidemia, diabetes mellitus, hypertension and sedentary lifestyle. She is following a generally unhealthy diet. Her overall blood glucose range is 110-130 mg/dl.  ?Gynecologic Exam ?The patient's primary symptoms include genital itching. The patient's pertinent negatives include no genital odor, vaginal bleeding or vaginal discharge. The patient is experiencing no pain.  ? ? ? ?Review of Systems   ?Constitutional:  Negative for malaise/fatigue.  ?Eyes:  Negative for blurred vision.  ?Respiratory:  Negative for shortness of breath.   ?Genitourinary:  Negative for vaginal discharge.  ?All other systems reviewed and are negative. ? ?   ?Objective:  ? Physical Exam ?Vitals reviewed.  ?Constitutional:   ?   General: She is not in acute distress. ?   Appearance: She is well-developed. She is obese.  ?HENT:  ?   Head: Normocephalic and atraumatic.  ?   Right Ear: Tympanic membrane normal.  ?   Left Ear: Tympanic membrane normal.  ?Eyes:  ?   Pupils: Pupils are equal, round, and reactive to light.  ?Neck:  ?   Thyroid: No thyromegaly.  ?Cardiovascular:  ?   Rate and Rhythm: Normal rate and regular rhythm.  ?   Heart sounds: Normal heart sounds. No murmur heard. ?Pulmonary:  ?   Effort: Pulmonary effort is normal. No respiratory distress.  ?   Breath sounds: Normal breath sounds. No wheezing.  ?Chest:  ?Breasts: ?   Right: No swelling, bleeding, inverted nipple, mass, nipple discharge, skin change or tenderness.  ?   Left: No swelling, bleeding, inverted nipple, mass, nipple discharge, skin change or tenderness.  ?Abdominal:  ?   General: Bowel sounds are normal. There is no distension.  ?   Palpations: Abdomen is soft.  ?   Tenderness: There is no abdominal tenderness.  ?Genitourinary: ?   General: Normal vulva.  ?   Labia:     ?   Right: Tenderness present.     ?   Left: Tenderness present.   ?   Cervix: Friability and lesion present.  ? ? ? ?   Comments: Bimanual exam- no adnexal masses or tenderness, ovaries nonpalpable  ? ?Cervix lesion, no pain ? ?  Labia erythemas and white ?Musculoskeletal:     ?   General: No tenderness. Normal range of motion.  ?   Cervical back: Normal range of motion and neck supple.  ?Skin: ?   General: Skin is warm and dry.  ?Neurological:  ?   Mental Status: She is alert and oriented to person, place, and time.  ?   Cranial Nerves: No cranial nerve deficit.  ?   Deep Tendon Reflexes:  Reflexes are normal and symmetric.  ?Psychiatric:     ?   Behavior: Behavior normal.     ?   Thought Content: Thought content normal.     ?   Judgment: Judgment normal.  ? ? ? ? ?BP 131/76   Pulse 82   Temp 97.6 ?F (36.4 ?C) (Temporal)   Ht 5' 2"  (1.575 m)   Wt 201 lb 9.6 oz (91.4 kg)   BMI 36.87 kg/m?  ? ?   ?Assessment & Plan:  ? ?Tammy Lawson comes in today with chief complaint of Annual Exam ? ? ?Diagnosis and orders addressed: ? ?1. Essential hypertension ?- CMP14+EGFR ?- CBC with Differential/Platelet ? ?2. Morbid obesity ([place]) ?- CMP14+EGFR ?- CBC with Differential/Platelet ? ?3. Vitamin D deficiency ?- CMP14+EGFR ?- CBC with Differential/Platelet ? ?4. Pure hypercholesterolemia ?- CMP14+EGFR ?- CBC with Differential/Platelet ? ?5. Prediabetes ? ?- CMP14+EGFR ?- CBC with Differential/Platelet ? ?6. Annual physical exam ?- CMP14+EGFR ?- CBC with Differential/Platelet ?- Lipid panel ?- TSH ? ?7. Encounter for gynecological examination with abnormal finding ?Pap pending  ?- CMP14+EGFR ?- CBC with Differential/Platelet ? ?8. Lichen sclerosus ?- clobetasol cream (TEMOVATE) 0.05 %; Apply 1 application. topically 2 (two) times daily.  Dispense: 60 g; Refill: 2 ? ? ?Labs pending ?Health Maintenance reviewed ?Diet and exercise encouraged ? ?Follow up plan: ?4 months  ? ? ?Evelina Dun, FNP ? ? ?

## 2021-08-10 LAB — CMP14+EGFR
ALT: 16 IU/L (ref 0–32)
AST: 25 IU/L (ref 0–40)
Albumin/Globulin Ratio: 2.2 (ref 1.2–2.2)
Albumin: 4.6 g/dL (ref 3.8–4.8)
Alkaline Phosphatase: 69 IU/L (ref 44–121)
BUN/Creatinine Ratio: 16 (ref 12–28)
BUN: 12 mg/dL (ref 8–27)
Bilirubin Total: 0.6 mg/dL (ref 0.0–1.2)
CO2: 24 mmol/L (ref 20–29)
Calcium: 9.6 mg/dL (ref 8.7–10.3)
Chloride: 104 mmol/L (ref 96–106)
Creatinine, Ser: 0.74 mg/dL (ref 0.57–1.00)
Globulin, Total: 2.1 g/dL (ref 1.5–4.5)
Glucose: 96 mg/dL (ref 70–99)
Potassium: 4.7 mmol/L (ref 3.5–5.2)
Sodium: 144 mmol/L (ref 134–144)
Total Protein: 6.7 g/dL (ref 6.0–8.5)
eGFR: 89 mL/min/{1.73_m2} (ref >59)

## 2021-08-10 LAB — CBC WITH DIFFERENTIAL/PLATELET
Basophils Absolute: 0 10*3/uL (ref 0.0–0.2)
Basos: 1 %
EOS (ABSOLUTE): 0.1 10*3/uL (ref 0.0–0.4)
Eos: 2 %
Hematocrit: 41.5 % (ref 34.0–46.6)
Hemoglobin: 13.5 g/dL (ref 11.1–15.9)
Immature Grans (Abs): 0 10*3/uL (ref 0.0–0.1)
Immature Granulocytes: 0 %
Lymphocytes Absolute: 1.1 10*3/uL (ref 0.7–3.1)
Lymphs: 21 %
MCH: 30 pg (ref 26.6–33.0)
MCHC: 32.5 g/dL (ref 31.5–35.7)
MCV: 92 fL (ref 79–97)
Monocytes Absolute: 0.4 10*3/uL (ref 0.1–0.9)
Monocytes: 7 %
Neutrophils Absolute: 3.7 10*3/uL (ref 1.4–7.0)
Neutrophils: 69 %
Platelets: 185 10*3/uL (ref 150–450)
RBC: 4.5 x10E6/uL (ref 3.77–5.28)
RDW: 13.3 % (ref 11.7–15.4)
WBC: 5.3 10*3/uL (ref 3.4–10.8)

## 2021-08-10 LAB — LIPID PANEL
Chol/HDL Ratio: 2.5 ratio (ref 0.0–4.4)
Cholesterol, Total: 112 mg/dL (ref 100–199)
HDL: 45 mg/dL (ref >39)
LDL Chol Calc (NIH): 51 mg/dL (ref 0–99)
Triglycerides: 82 mg/dL (ref 0–149)
VLDL Cholesterol Cal: 16 mg/dL (ref 5–40)

## 2021-08-10 LAB — TSH: TSH: 0.726 u[IU]/mL (ref 0.450–4.500)

## 2021-08-13 LAB — CYTOLOGY - PAP
Chlamydia: NEGATIVE
Comment: NEGATIVE
Comment: NORMAL
Diagnosis: NEGATIVE
Neisseria Gonorrhea: NEGATIVE

## 2021-09-11 ENCOUNTER — Ambulatory Visit (INDEPENDENT_AMBULATORY_CARE_PROVIDER_SITE_OTHER): Payer: Medicare Other

## 2021-09-11 VITALS — Wt 198.0 lb

## 2021-09-11 DIAGNOSIS — Z0000 Encounter for general adult medical examination without abnormal findings: Secondary | ICD-10-CM | POA: Diagnosis not present

## 2021-09-11 NOTE — Patient Instructions (Signed)
Ms. Crossley , ?Thank you for taking time to come for your Medicare Wellness Visit. I appreciate your ongoing commitment to your health goals. Please review the following plan we discussed and let me know if I can assist you in the future.  ? ?Screening recommendations/referrals: ?Colonoscopy: Done 06/02/2020 - repeat in 7 years ?Mammogram: Done 04/04/2021 - Repeat annually ?Bone Density: Done 07/22/2019 - Repeat every 2 years *get this at next visit ?Recommended yearly ophthalmology/optometry visit for glaucoma screening and checkup ?Recommended yearly dental visit for hygiene and checkup ? ?Vaccinations: ?Influenza vaccine: Done 02/08/2021 - Repeat annually  ?Pneumococcal vaccine: [id number] Done 09/07/2020 - Due for Pneumovax-23 ?Tdap vaccine: Done 01/20/2019 - Repeat in 10 years  ?Shingles vaccine: Due - Shingrix is 2 doses 2-6 months apart and over 90% effective  *consider at next visit   ?Covid-19: Done 11/08/2019 & 11/29/2019 - for boosters, contact pharmacy ? ?Advanced directives: Advance directive discussed with you today. I have provided a copy for you to complete at home and have notarized. Once this is complete please bring a copy in to our office so we can scan it into your chart.  ? ?Conditions/risks identified: Keep up the great work staying active! Aim for 30 minutes of exercise or brisk walking, 6-8 glasses of water, and 5 servings of fruits and vegetables each day.  ? ?Next appointment: Follow up in one year for your annual wellness visit  ? ? ?Preventive Care 67 Years and Older, Female ?Preventive care refers to lifestyle choices and visits with your health care provider that can promote health and wellness. ?What does preventive care include? ?A yearly physical exam. This is also called an annual well check. ?Dental exams once or twice a year. ?Routine eye exams. Ask your health care provider how often you should have your eyes checked. ?Personal lifestyle choices, including: ?Daily care of your teeth and  gums. ?Regular physical activity. ?Eating a healthy diet. ?Avoiding tobacco and drug use. ?Limiting alcohol use. ?Practicing safe sex. ?Taking low-dose aspirin every day. ?Taking vitamin and mineral supplements as recommended by your health care provider. ?What happens during an annual well check? ?The services and screenings done by your health care provider during your annual well check will depend on your age, overall health, lifestyle risk factors, and family history of disease. ?Counseling  ?Your health care provider may ask you questions about your: ?Alcohol use. ?Tobacco use. ?Drug use. ?Emotional well-being. ?Home and relationship well-being. ?Sexual activity. ?Eating habits. ?History of falls. ?Memory and ability to understand (cognition). ?Work and work Statistician. ?Reproductive health. ?Screening  ?You may have the following tests or measurements: ?Height, weight, and BMI. ?Blood pressure. ?Lipid and cholesterol levels. These may be checked every 5 years, or more frequently if you are over 67 years old. ?Skin check. ?Lung cancer screening. You may have this screening every year starting at age 67 if you have a 30-pack-year history of smoking and currently smoke or have quit within the past 15 years. ?Fecal occult blood test (FOBT) of the stool. You may have this test every year starting at age 67. ?Flexible sigmoidoscopy or colonoscopy. You may have a sigmoidoscopy every 5 years or a colonoscopy every 10 years starting at age 67. ?Hepatitis C blood test. ?Hepatitis B blood test. ?Sexually transmitted disease (STD) testing. ?Diabetes screening. This is done by checking your blood sugar (glucose) after you have not eaten for a while (fasting). You may have this done every 1-3 years. ?Bone density scan. This is done to  screen for osteoporosis. You may have this done starting at age 67. ?Mammogram. This may be done every 1-2 years. Talk to your health care provider about how often you should have regular  mammograms. ?Talk with your health care provider about your test results, treatment options, and if necessary, the need for more tests. ?Vaccines  ?Your health care provider may recommend certain vaccines, such as: ?Influenza vaccine. This is recommended every year. ?Tetanus, diphtheria, and acellular pertussis (Tdap, Td) vaccine. You may need a Td booster every 10 years. ?Zoster vaccine. You may need this after age 67. ?Pneumococcal 13-valent conjugate (PCV13) vaccine. One dose is recommended after age 67. ?Pneumococcal polysaccharide (PPSV23) vaccine. One dose is recommended after age 67. ?Talk to your health care provider about which screenings and vaccines you need and how often you need them. ?This information is not intended to replace advice given to you by your health care provider. Make sure you discuss any questions you have with your health care provider. ?Document Released: 05/19/2015 Document Revised: 01/10/2016 Document Reviewed: 02/21/2015 ?Elsevier Interactive Patient Education ? 2017 [employer]. ? ?Fall Prevention in the Home ?Falls can cause injuries. They can happen to people of all ages. There are many things you can do to make your home safe and to help prevent falls. ?What can I do on the outside of my home? ?Regularly fix the edges of walkways and driveways and fix any cracks. ?Remove anything that might make you trip as you walk through a door, such as a raised step or threshold. ?Trim any bushes or trees on the path to your home. ?Use bright outdoor lighting. ?Clear any walking paths of anything that might make someone trip, such as rocks or tools. ?Regularly check to see if handrails are loose or broken. Make sure that both sides of any steps have handrails. ?Any raised decks and porches should have guardrails on the edges. ?Have any leaves, snow, or ice cleared regularly. ?Use sand or salt on walking paths during winter. ?Clean up any spills in your garage right away. This includes oil  or grease spills. ?What can I do in the bathroom? ?Use night lights. ?Install grab bars by the toilet and in the tub and shower. Do not use towel bars as grab bars. ?Use non-skid mats or decals in the tub or shower. ?If you need to sit down in the shower, use a plastic, non-slip stool. ?Keep the floor dry. Clean up any water that spills on the floor as soon as it happens. ?Remove soap buildup in the tub or shower regularly. ?Attach bath mats securely with double-sided non-slip rug tape. ?Do not have throw rugs and other things on the floor that can make you trip. ?What can I do in the bedroom? ?Use night lights. ?Make sure that you have a light by your bed that is easy to reach. ?Do not use any sheets or blankets that are too big for your bed. They should not hang down onto the floor. ?Have a firm chair that has side arms. You can use this for support while you get dressed. ?Do not have throw rugs and other things on the floor that can make you trip. ?What can I do in the kitchen? ?Clean up any spills right away. ?Avoid walking on wet floors. ?Keep items that you use a lot in easy-to-reach places. ?If you need to reach something above you, use a strong step stool that has a grab bar. ?Keep electrical cords out of the way. ?  Do not use floor polish or wax that makes floors slippery. If you must use wax, use non-skid floor wax. ?Do not have throw rugs and other things on the floor that can make you trip. ?What can I do with my stairs? ?Do not leave any items on the stairs. ?Make sure that there are handrails on both sides of the stairs and use them. Fix handrails that are broken or loose. Make sure that handrails are as long as the stairways. ?Check any carpeting to make sure that it is firmly attached to the stairs. Fix any carpet that is loose or worn. ?Avoid having throw rugs at the top or bottom of the stairs. If you do have throw rugs, attach them to the floor with carpet tape. ?Make sure that you have a light  switch at the top of the stairs and the bottom of the stairs. If you do not have them, ask someone to add them for you. ?What else can I do to help prevent falls? ?Wear shoes that: ?Do not have high heels. ?H

## 2021-09-11 NOTE — Progress Notes (Signed)
? ?Subjective:  ? Tammy Lawson is a 66 y.o. female who presents for Medicare Annual (Subsequent) preventive examination. ? ?Virtual Visit via Telephone Note ? ?I connected with  Tammy Lawson on 09/11/21 at  1:15 PM EDT by telephone and verified that I am speaking with the correct person using two identifiers. ? ?Location: ?Patient: Home ?Provider: [facility] ?Persons participating in the virtual visit: patient/Nurse Health Advisor ?  ?I discussed the limitations, risks, security and privacy concerns of performing an evaluation and management service by telephone and the availability of in person appointments. The patient expressed understanding and agreed to proceed. ? ?Interactive audio and video telecommunications were attempted between this nurse and patient, however failed, due to patient having technical difficulties OR patient did not have access to video capability.  We continued and completed visit with audio only. ? ?Some vital signs may be absent or patient reported.  ? ?Barbarann Kelly Dionne Ano, LPN  ? ?Review of Systems    ? ?Cardiac Risk Factors include: advanced age (>63mn, >>58women);obesity (BMI >30kg/m2);dyslipidemia;hypertension ? ?   ?Objective:  ?  ?Today's Vitals  ? 09/11/21 1318  ?Weight: 198 lb (89.8 kg)  ? ?Body mass index is 36.21 kg/m?. ? ? ?  09/11/2021  ?  1:25 PM 09/07/2020  ?  9:12 AM 06/02/2020  ? 10:03 AM  ?Advanced Directives  ?Does Patient Have a Medical Advance Directive? No No No  ?Would patient like information on creating a medical advance directive? Yes (MAU/Ambulatory/Procedural Areas - Information given) No - Patient declined No - Patient declined  ? ? ?Current Medications (verified) ?Outpatient Encounter Medications as of 09/11/2021  ?Medication Sig  ? Ascorbic Acid (VITAMIN C) 100 MG tablet Take 100 mg by mouth daily.  ? b complex vitamins capsule Take 1 capsule by mouth daily.  ? CALCIUM-MAGNESIUM-ZINC PO Take 1 capsule by mouth once a week.  ? clobetasol cream (TEMOVATE) 00.86% Apply 1  application. topically 2 (two) times daily.  ? losartan (COZAAR) 50 MG tablet TAKE 1 TABLET(50 MG) BY MOUTH DAILY  ? rosuvastatin (CRESTOR) 10 MG tablet TAKE 1 TABLET(10 MG) BY MOUTH DAILY  ? Vitamin D, Ergocalciferol, (DRISDOL) 1.25 MG (50000 UNIT) CAPS capsule TAKE 1 CAPSULE BY MOUTH EVERY 7 DAYS  ? fluticasone (FLONASE) 50 MCG/ACT nasal spray SHAKE LIQUID AND USE 2 SPRAYS IN EACH NOSTRIL DAILY (Patient not taking: Reported on 09/11/2021)  ? ?No facility-administered encounter medications on file as of 09/11/2021.  ? ? ?Allergies (verified) ?Other and Augmentin [amoxicillin-pot clavulanate]  ? ?History: ?Past Medical History:  ?Diagnosis Date  ? Cataract   ? HLD (hyperlipidemia)   ? HTN (hypertension)   ? Osteopenia   ? Vitamin D deficiency   ? ?Past Surgical History:  ?Procedure Laterality Date  ? NO PAST SURGERIES    ? ?Family History  ?Problem Relation Age of Onset  ? Diabetes Mother   ? Heart disease Mother   ? Stroke Mother   ? Hypertension Mother   ? Hyperlipidemia Mother   ? COPD Father   ? Rectal cancer Father   ? Colon polyps Father   ? Atrial fibrillation Father   ? Hyperlipidemia Father   ? Prostate cancer Father   ? Lung disease Father   ?     tumor on lung  ? Cancer Sister   ?     Mets and don't know origin  ? Diabetes Paternal Grandmother   ? Albinism Sister   ? Pulmonary fibrosis Sister   ? Other  Sister   ?     Hermansky pudlac syndrome  ? Diabetes Son   ? Hypertension Son   ? Hyperlipidemia Son   ? Esophageal cancer Neg Hx   ? Stomach cancer Neg Hx   ? ?Social History  ? ?Socioeconomic History  ? Marital status: Married  ?  Spouse name: Girard Cooter  ? Number of children: 1  ? Years of education: 51  ? Highest education level: High school graduate  ?Occupational History  ? Occupation: retired  ?Tobacco Use  ? Smoking status: Never  ? Smokeless tobacco: Never  ?Vaping Use  ? Vaping Use: Never used  ?Substance and Sexual Activity  ? Alcohol use: No  ? Drug use: No  ? Sexual activity: Yes  ?Other Topics  Concern  ? Not on file  ?Social History Narrative  ? Son lives 25 miles away  ? Lives with her husband in one level home  ? ?Social Determinants of Health  ? ?Financial Resource Strain: Low Risk   ? Difficulty of Paying Living Expenses: Not hard at all  ?Food Insecurity: No Food Insecurity  ? Worried About Charity fundraiser in the Last Year: Never true  ? Ran Out of Food in the Last Year: Never true  ?Transportation Needs: No Transportation Needs  ? Lack of Transportation (Medical): No  ? Lack of Transportation (Non-Medical): No  ?Physical Activity: Sufficiently Active  ? Days of Exercise per Week: 5 days  ? Minutes of Exercise per Session: 50 min  ?Stress: No Stress Concern Present  ? Feeling of Stress : Not at all  ?Social Connections: Socially Integrated  ? Frequency of Communication with Friends and Family: More than three times a week  ? Frequency of Social Gatherings with Friends and Family: More than three times a week  ? Attends Religious Services: More than 4 times per year  ? Active Member of Clubs or Organizations: Yes  ? Attends Archivist Meetings: More than 4 times per year  ? Marital Status: Married  ? ? ?Tobacco Counseling ?Counseling given: Not Answered ? ? ?Clinical Intake: ? ?Pre-visit preparation completed: Yes ? ?Pain : No/denies pain ? ?  ? ?BMI - recorded: 36.21 ?Nutritional Status: BMI > 30  Obese ?Nutritional Risks: None ?Diabetes: No ? ?How often do you need to have someone help you when you read instructions, pamphlets, or other written materials from your doctor or pharmacy?: 1 - Never ? ?Diabetic? no ? ?Interpreter Needed?: No ? ?Information entered by :: Nealie Mchatton, LPN ? ? ?Activities of Daily Living ? ?  09/11/2021  ?  1:23 PM  ?In your present state of health, do you have any difficulty performing the following activities:  ?Hearing? 0  ?Vision? 0  ?Difficulty concentrating or making decisions? 0  ?Walking or climbing stairs? 0  ?Dressing or bathing? 0  ?Doing errands,  shopping? 0  ?Preparing Food and eating ? N  ?Using the Toilet? N  ?In the past six months, have you accidently leaked urine? Y  ?Comment mild - if she sneezes or coughs a lot  ?Do you have problems with loss of bowel control? N  ?Managing your Medications? N  ?Managing your Finances? N  ?Housekeeping or managing your Housekeeping? N  ? ? ?Patient Care Team: ?Sharion Balloon, FNP as PCP - General (Family Medicine) ?Mauri Pole, MD as Consulting Physician (Gastroenterology) ?Otelia Sergeant, OD as Referring Physician (Optometry) ? ?Indicate any recent Medical Services you may have received  from other than [facility] providers in the past year (date may be approximate). ? ?   ?Assessment:  ? This is a routine wellness examination for Renatta. ? ?Hearing/Vision screen ?Hearing Screening - Comments:: Denies hearing difficulties   ?Vision Screening - Comments:: Wears contact lenses - up to date with routine eye exams with [facility] in [place] ? ?Dietary issues and exercise activities discussed: ?Current Exercise Habits: Home exercise routine, Type of exercise: walking;stretching, Time (Minutes): 45, Frequency (Times/Week): 5, Weekly Exercise (Minutes/Week): 225, Intensity: Mild, Exercise limited by: None identified ? ? Goals Addressed   ? ?  ?  ?  ?  ? This Visit's Progress  ?  Patient Stated     ?  Continue exercising, staying active, eating healthy and lose some weight  ?  ? ?  ? ?Depression Screen ? ?  09/11/2021  ?  1:22 PM 02/08/2021  ?  8:38 AM 09/07/2020  ?  9:09 AM 08/08/2020  ?  9:38 AM 01/25/2020  ? 10:26 AM 07/22/2019  ? 10:50 AM 05/11/2019  ?  4:51 PM  ?PHQ 2/9 Scores  ?PHQ - 2 Score 0 0 0 0 0 0 0  ?PHQ- 9 Score  0       ?  ?Fall Risk ? ?  09/11/2021  ?  1:19 PM 02/08/2021  ?  8:38 AM 09/07/2020  ?  9:08 AM 08/08/2020  ?  9:38 AM 07/22/2019  ? 10:50 AM  ?Fall Risk   ?Falls in the past year? 0 0 0 0 0  ?Number falls in past yr: 0      ?Injury with Fall? 0      ?Risk for fall due to : No Fall Risks      ?Follow up Falls  prevention discussed      ? ? ?FALL RISK PREVENTION PERTAINING TO THE HOME: ? ?Any stairs in or around the home? No  ?If so, are there any without handrails? No  ?Home free of loose throw rugs in walkways, pet beds, el

## 2021-09-30 ENCOUNTER — Other Ambulatory Visit: Payer: Self-pay | Admitting: Family

## 2021-09-30 DIAGNOSIS — E7800 Pure hypercholesterolemia, unspecified: Secondary | ICD-10-CM

## 2021-10-05 ENCOUNTER — Other Ambulatory Visit: Payer: Self-pay | Admitting: Family

## 2021-10-05 DIAGNOSIS — I10 Essential (primary) hypertension: Secondary | ICD-10-CM

## 2021-10-31 ENCOUNTER — Other Ambulatory Visit: Payer: Self-pay | Admitting: Family

## 2021-10-31 DIAGNOSIS — E559 Vitamin D deficiency, unspecified: Secondary | ICD-10-CM

## 2021-12-29 ENCOUNTER — Other Ambulatory Visit: Payer: Self-pay | Admitting: Family

## 2021-12-29 DIAGNOSIS — E7800 Pure hypercholesterolemia, unspecified: Secondary | ICD-10-CM

## 2022-01-08 DIAGNOSIS — H524 Presbyopia: Secondary | ICD-10-CM | POA: Diagnosis not present

## 2022-01-08 DIAGNOSIS — H2513 Age-related nuclear cataract, bilateral: Secondary | ICD-10-CM | POA: Diagnosis not present

## 2022-01-08 LAB — HM DIABETES EYE EXAM

## 2022-02-08 ENCOUNTER — Other Ambulatory Visit: Payer: Self-pay | Admitting: Family

## 2022-02-08 ENCOUNTER — Ambulatory Visit (INDEPENDENT_AMBULATORY_CARE_PROVIDER_SITE_OTHER): Payer: Medicare Other

## 2022-02-08 ENCOUNTER — Encounter: Payer: Self-pay | Admitting: Family

## 2022-02-08 ENCOUNTER — Ambulatory Visit (INDEPENDENT_AMBULATORY_CARE_PROVIDER_SITE_OTHER): Payer: Medicare Other | Admitting: Family

## 2022-02-08 VITALS — BP 134/74 | HR 88 | Temp 97.7°F | Ht 62.0 in | Wt 196.6 lb

## 2022-02-08 DIAGNOSIS — Z23 Encounter for immunization: Secondary | ICD-10-CM | POA: Diagnosis not present

## 2022-02-08 DIAGNOSIS — E559 Vitamin D deficiency, unspecified: Secondary | ICD-10-CM | POA: Diagnosis not present

## 2022-02-08 DIAGNOSIS — I10 Essential (primary) hypertension: Secondary | ICD-10-CM | POA: Diagnosis not present

## 2022-02-08 DIAGNOSIS — R7303 Prediabetes: Secondary | ICD-10-CM

## 2022-02-08 DIAGNOSIS — M8589 Other specified disorders of bone density and structure, multiple sites: Secondary | ICD-10-CM | POA: Diagnosis not present

## 2022-02-08 DIAGNOSIS — E7800 Pure hypercholesterolemia, unspecified: Secondary | ICD-10-CM

## 2022-02-08 DIAGNOSIS — Z780 Asymptomatic menopausal state: Secondary | ICD-10-CM

## 2022-02-08 DIAGNOSIS — M8580 Other specified disorders of bone density and structure, unspecified site: Secondary | ICD-10-CM | POA: Insufficient documentation

## 2022-02-08 LAB — BAYER DCA HB A1C WAIVED: HB A1C (BAYER DCA - WAIVED): 5.8 % — ABNORMAL HIGH (ref 4.8–5.6)

## 2022-02-08 NOTE — Patient Instructions (Signed)
Health Maintenance After Age 67 After age 67, you are at a higher risk for certain long-term diseases and infections as well as injuries from falls. Falls are a major cause of broken bones and head injuries in people who are older than age 67. Getting regular preventive care can help to keep you healthy and well. Preventive care includes getting regular testing and making lifestyle changes as recommended by your health care provider. Talk with your health care provider about: Which screenings and tests you should have. A screening is a test that checks for a disease when you have no symptoms. A diet and exercise plan that is right for you. What should I know about screenings and tests to prevent falls? Screening and testing are the best ways to find a health problem early. Early diagnosis and treatment give you the best chance of managing medical conditions that are common after age 67. Certain conditions and lifestyle choices may make you more likely to have a fall. Your health care provider may recommend: Regular vision checks. Poor vision and conditions such as cataracts can make you more likely to have a fall. If you wear glasses, make sure to get your prescription updated if your vision changes. Medicine review. Work with your health care provider to regularly review all of the medicines you are taking, including over-the-counter medicines. Ask your health care provider about any side effects that may make you more likely to have a fall. Tell your health care provider if any medicines that you take make you feel dizzy or sleepy. Strength and balance checks. Your health care provider may recommend certain tests to check your strength and balance while standing, walking, or changing positions. Foot health exam. Foot pain and numbness, as well as not wearing proper footwear, can make you more likely to have a fall. Screenings, including: Osteoporosis screening. Osteoporosis is a condition that causes  the bones to get weaker and break more easily. Blood pressure screening. Blood pressure changes and medicines to control blood pressure can make you feel dizzy. Depression screening. You may be more likely to have a fall if you have a fear of falling, feel depressed, or feel unable to do activities that you used to do. Alcohol use screening. Using too much alcohol can affect your balance and may make you more likely to have a fall. Follow these instructions at home: Lifestyle Do not drink alcohol if: Your health care provider tells you not to drink. If you drink alcohol: Limit how much you have to: 0-1 drink a day for women. 0-2 drinks a day for men. Know how much alcohol is in your drink. In the [place] one drink equals one 12 oz bottle of beer (355 mL), one 5 oz glass of wine (148 mL), or one 1 oz glass of hard liquor (44 mL). Do not use any products that contain nicotine or tobacco. These products include cigarettes, chewing tobacco, and vaping devices, such as e-cigarettes. If you need help quitting, ask your health care provider. Activity  Follow a regular exercise program to stay fit. This will help you maintain your balance. Ask your health care provider what types of exercise are appropriate for you. If you need a cane or walker, use it as recommended by your health care provider. Wear supportive shoes that have nonskid soles. Safety  Remove any tripping hazards, such as rugs, cords, and clutter. Install safety equipment such as grab bars in bathrooms and safety rails on stairs. Keep rooms and walkways   well-lit. General instructions Talk with your health care provider about your risks for falling. Tell your health care provider if: You fall. Be sure to tell your health care provider about all falls, even ones that seem minor. You feel dizzy, tiredness (fatigue), or off-balance. Take over-the-counter and prescription medicines only as told by your health care provider. These include  supplements. Eat a healthy diet and maintain a healthy weight. A healthy diet includes low-fat dairy products, low-fat (lean) meats, and fiber from whole grains, beans, and lots of fruits and vegetables. Stay current with your vaccines. Schedule regular health, dental, and eye exams. Summary Having a healthy lifestyle and getting preventive care can help to protect your health and wellness after age 67. Screening and testing are the best way to find a health problem early and help you avoid having a fall. Early diagnosis and treatment give you the best chance for managing medical conditions that are more common for people who are older than age 67. Falls are a major cause of broken bones and head injuries in people who are older than age 67. Take precautions to prevent a fall at home. Work with your health care provider to learn what changes you can make to improve your health and wellness and to prevent falls. This information is not intended to replace advice given to you by your health care provider. Make sure you discuss any questions you have with your health care provider. Document Revised: 09/11/2020 Document Reviewed: 09/11/2020 Elsevier Patient Education  2023 [employer]  

## 2022-02-08 NOTE — Progress Notes (Signed)
Subjective:    Patient ID: Tammy Lawson, female    DOB: 1956-03-13, 66 y.o.   MRN: [id number]  Chief Complaint  Patient presents with   Medical Management of Chronic Issues   PT presents to the office today for chronic She is considered morbid obese with a BMI of 36 and co morbility of HTN.   Hypertension This is a chronic problem. The current episode started more than 1 year ago. The problem has been resolved since onset. The problem is controlled. Pertinent negatives include no blurred vision, malaise/fatigue, peripheral edema or shortness of breath. Risk factors for coronary artery disease include dyslipidemia, obesity and sedentary lifestyle. The current treatment provides moderate improvement.  Hyperlipidemia This is a chronic problem. The current episode started more than 1 year ago. The problem is controlled. Recent lipid tests were reviewed and are normal. Exacerbating diseases include obesity. Pertinent negatives include no shortness of breath. Current antihyperlipidemic treatment includes statins. The current treatment provides moderate improvement of lipids. Risk factors for coronary artery disease include dyslipidemia, hypertension, post-menopausal and a sedentary lifestyle.  Diabetes She presents for her follow-up diabetic visit. She has type 2 (prediabetes) diabetes mellitus. Pertinent negatives for diabetes include no blurred vision and no foot paresthesias. Risk factors for coronary artery disease include diabetes mellitus, hypertension, post-menopausal, sedentary lifestyle and dyslipidemia. She is following a generally healthy diet. Her overall blood glucose range is 90-110 mg/dl. Eye exam is current.      Review of Systems  Constitutional:  Negative for malaise/fatigue.  Eyes:  Negative for blurred vision.  Respiratory:  Negative for shortness of breath.   All other systems reviewed and are negative.      Objective:   Physical Exam Vitals reviewed.  Constitutional:       General: She is not in acute distress.    Appearance: She is well-developed. She is obese.  HENT:     Head: Normocephalic and atraumatic.     Right Ear: Tympanic membrane normal.     Left Ear: Tympanic membrane normal.  Eyes:     Pupils: Pupils are equal, round, and reactive to light.  Neck:     Thyroid: No thyromegaly.  Cardiovascular:     Rate and Rhythm: Normal rate and regular rhythm.     Heart sounds: Normal heart sounds. No murmur heard. Pulmonary:     Effort: Pulmonary effort is normal. No respiratory distress.     Breath sounds: Normal breath sounds. No wheezing.  Abdominal:     General: Bowel sounds are normal. There is no distension.     Palpations: Abdomen is soft.     Tenderness: There is no abdominal tenderness.  Musculoskeletal:        General: No tenderness. Normal range of motion.     Cervical back: Normal range of motion and neck supple.  Skin:    General: Skin is warm and dry.  Neurological:     Mental Status: She is alert and oriented to person, place, and time.     Cranial Nerves: No cranial nerve deficit.     Deep Tendon Reflexes: Reflexes are normal and symmetric.  Psychiatric:        Behavior: Behavior normal.        Thought Content: Thought content normal.        Judgment: Judgment normal.       BP 134/74   Pulse 88   Temp 97.7 F (36.5 C) (Temporal)   Ht 5' 2"  (1.575 m)  Wt 196 lb 9.6 oz (89.2 kg)   SpO2 100%   BMI 35.96 kg/m      Assessment & Plan:  [name] comes in today with chief complaint of Medical Management of Chronic Issues   Diagnosis and orders addressed:  1. Need for immunization against influenza - Flu Vaccine QUAD High Dose(Fluad) - CMP14+EGFR - CBC with Differential/Platelet  2. Essential hypertension - CMP14+EGFR - CBC with Differential/Platelet  3. Morbid obesity ([place]) - CMP14+EGFR - CBC with Differential/Platelet  4. Pure hypercholesterolemia - CMP14+EGFR - CBC with Differential/Platelet  5.  Vitamin D deficiency - CMP14+EGFR - CBC with Differential/Platelet  6. Prediabetes - CMP14+EGFR - CBC with Differential/Platelet - Bayer DCA Hb A1c Waived  7. Post-menopausal - DG [facility] DEXA    Labs pending Health Maintenance reviewed Diet and exercise encouraged  Follow up plan: 6 months   Evelina Dun, FNP

## 2022-02-09 LAB — CBC WITH DIFFERENTIAL/PLATELET
Basophils Absolute: 0 10*3/uL (ref 0.0–0.2)
Basos: 1 %
EOS (ABSOLUTE): 0.1 10*3/uL (ref 0.0–0.4)
Eos: 1 %
Hematocrit: 43 % (ref 34.0–46.6)
Hemoglobin: 13.7 g/dL (ref 11.1–15.9)
Immature Grans (Abs): 0 10*3/uL (ref 0.0–0.1)
Immature Granulocytes: 0 %
Lymphocytes Absolute: 1.1 10*3/uL (ref 0.7–3.1)
Lymphs: 20 %
MCH: 29.6 pg (ref 26.6–33.0)
MCHC: 31.9 g/dL (ref 31.5–35.7)
MCV: 93 fL (ref 79–97)
Monocytes Absolute: 0.4 10*3/uL (ref 0.1–0.9)
Monocytes: 7 %
Neutrophils Absolute: 4 10*3/uL (ref 1.4–7.0)
Neutrophils: 71 %
Platelets: 207 10*3/uL (ref 150–450)
RBC: 4.63 x10E6/uL (ref 3.77–5.28)
RDW: 12.9 % (ref 11.7–15.4)
WBC: 5.5 10*3/uL (ref 3.4–10.8)

## 2022-02-09 LAB — CMP14+EGFR
ALT: 16 IU/L (ref 0–32)
AST: 21 IU/L (ref 0–40)
Albumin/Globulin Ratio: 2 (ref 1.2–2.2)
Albumin: 4.7 g/dL (ref 3.9–4.9)
Alkaline Phosphatase: 71 IU/L (ref 44–121)
BUN/Creatinine Ratio: 21 (ref 12–28)
BUN: 16 mg/dL (ref 8–27)
Bilirubin Total: 0.6 mg/dL (ref 0.0–1.2)
CO2: 23 mmol/L (ref 20–29)
Calcium: 9.7 mg/dL (ref 8.7–10.3)
Chloride: 102 mmol/L (ref 96–106)
Creatinine, Ser: 0.78 mg/dL (ref 0.57–1.00)
Globulin, Total: 2.3 g/dL (ref 1.5–4.5)
Glucose: 95 mg/dL (ref 70–99)
Potassium: 4.7 mmol/L (ref 3.5–5.2)
Sodium: 140 mmol/L (ref 134–144)
Total Protein: 7 g/dL (ref 6.0–8.5)
eGFR: 83 mL/min/{1.73_m2} (ref >59)

## 2022-02-19 ENCOUNTER — Other Ambulatory Visit: Payer: Self-pay | Admitting: Family

## 2022-02-19 DIAGNOSIS — E559 Vitamin D deficiency, unspecified: Secondary | ICD-10-CM

## 2022-03-22 DIAGNOSIS — K1321 Leukoplakia of oral mucosa, including tongue: Secondary | ICD-10-CM | POA: Diagnosis not present

## 2022-03-29 ENCOUNTER — Other Ambulatory Visit: Payer: Self-pay | Admitting: Family

## 2022-03-29 DIAGNOSIS — I10 Essential (primary) hypertension: Secondary | ICD-10-CM

## 2022-03-29 DIAGNOSIS — E7800 Pure hypercholesterolemia, unspecified: Secondary | ICD-10-CM

## 2022-05-16 ENCOUNTER — Encounter: Payer: Self-pay | Admitting: *Deleted

## 2022-05-31 IMAGING — MG MM DIGITAL SCREENING BILAT W/ TOMO AND CAD
6 of 10 series · 6 of 30 positions shown · non-contrast
Comparison: Previous exam(s).

CLINICAL DATA: Screening.

EXAM:
DIGITAL SCREENING BILATERAL MAMMOGRAM WITH TOMOSYNTHESIS AND CAD
TECHNIQUE: Bilateral screening digital craniocaudal and mediolateral oblique
mammograms were obtained. Bilateral screening digital breast
tomosynthesis was performed. The images were evaluated with
computer-aided detection.

[R CC synth-2D]
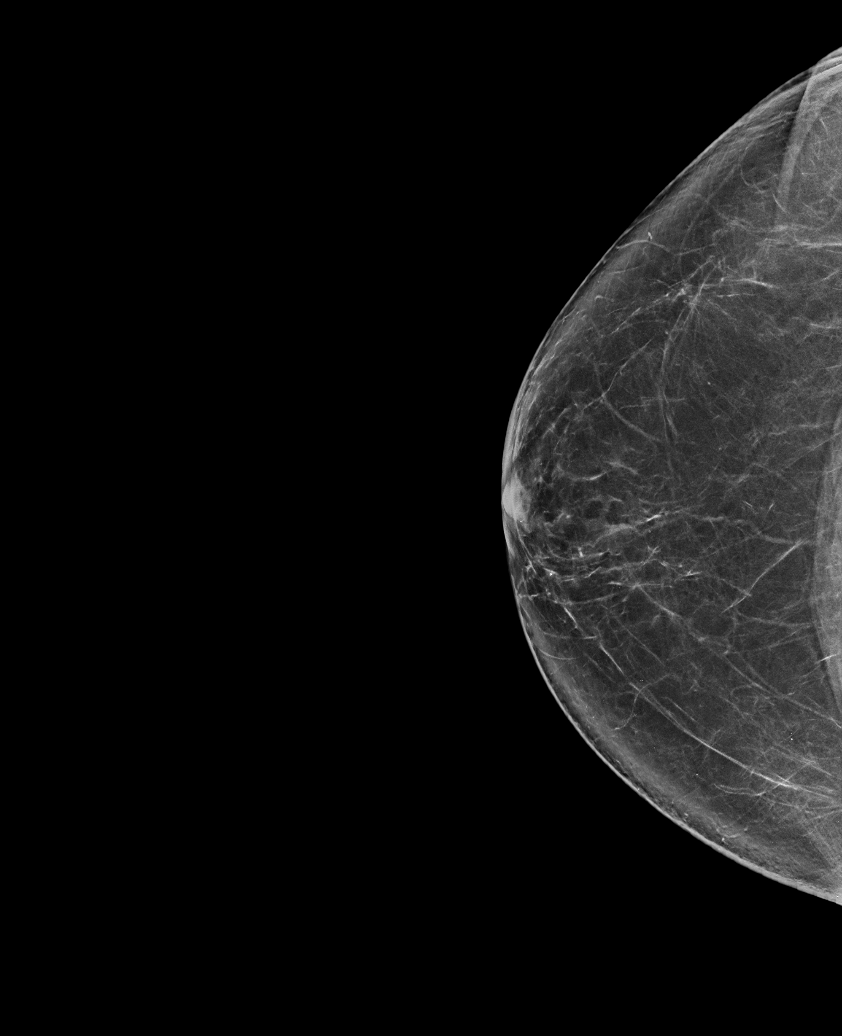

[R MLO synth-2D]
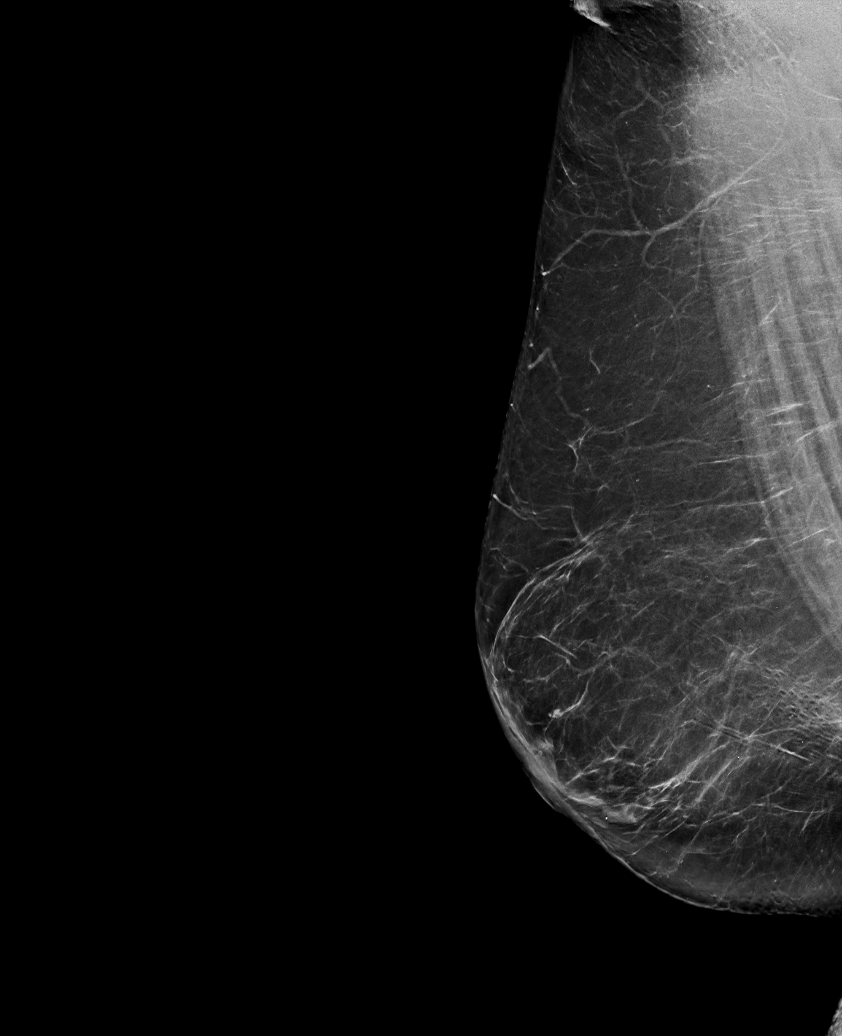

[L CC synth-2D]
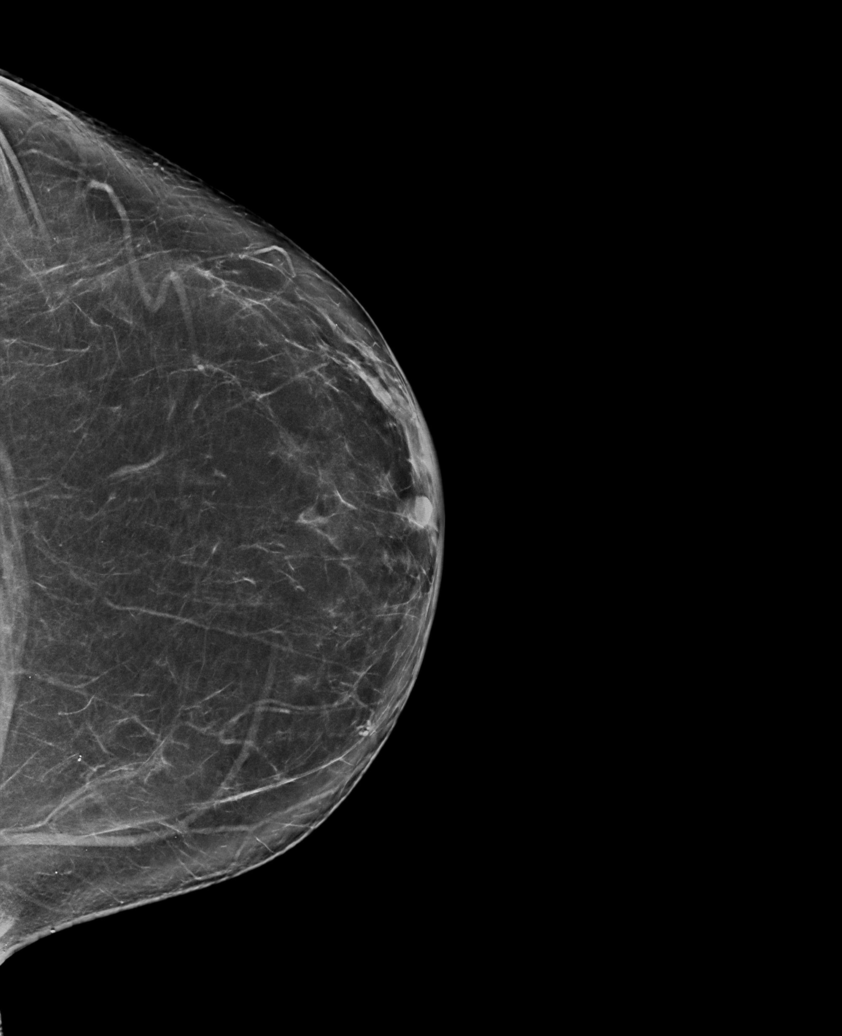

[L MLO synth-2D (1 of 2)]
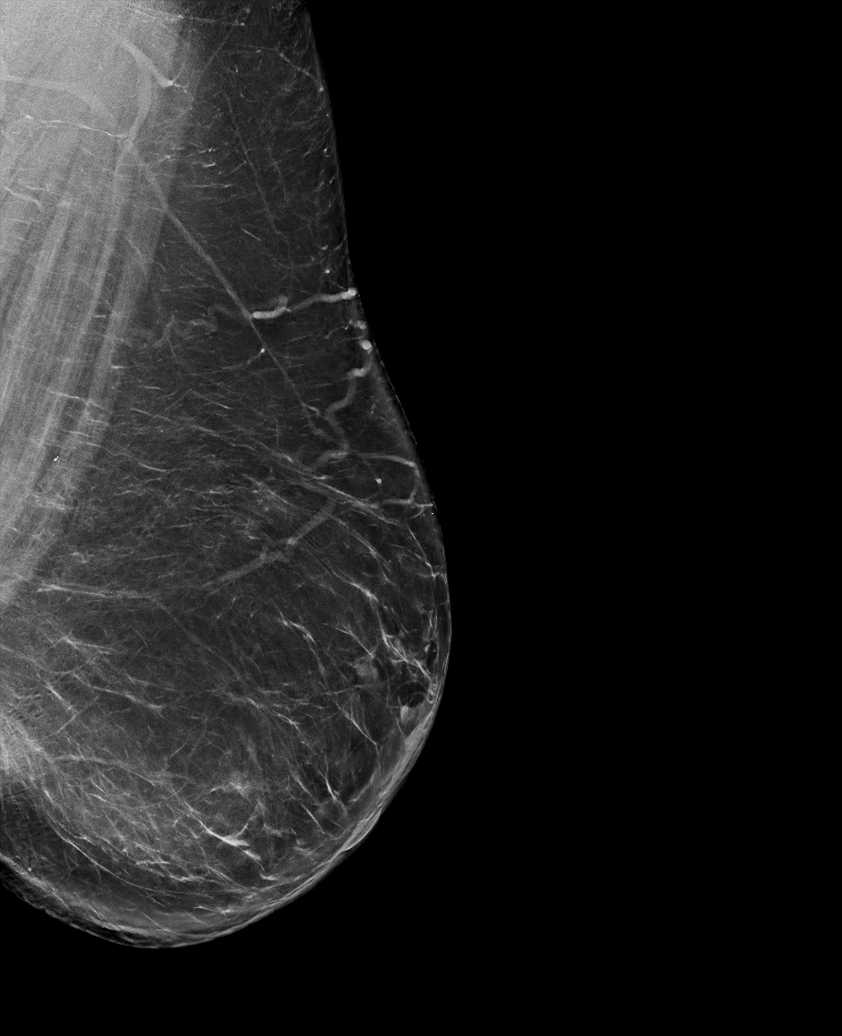

[L MLO synth-2D (2 of 2)]
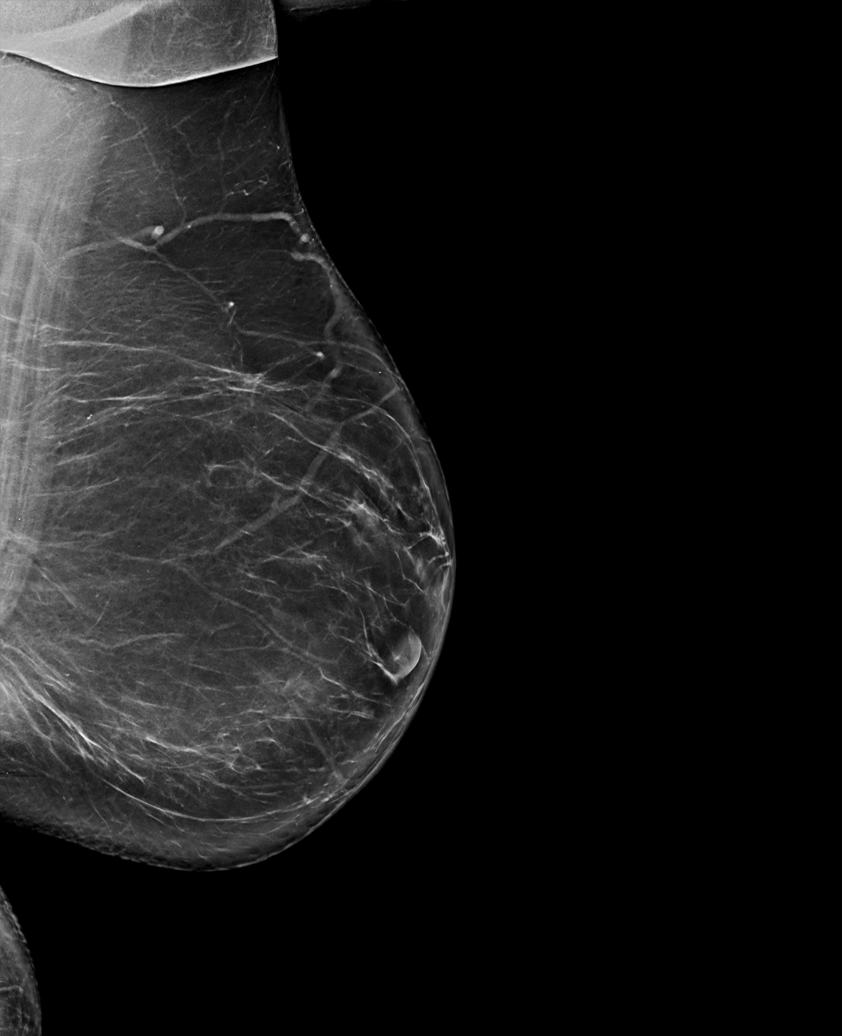

[R CC tomo · tomo slice 41/81.0]
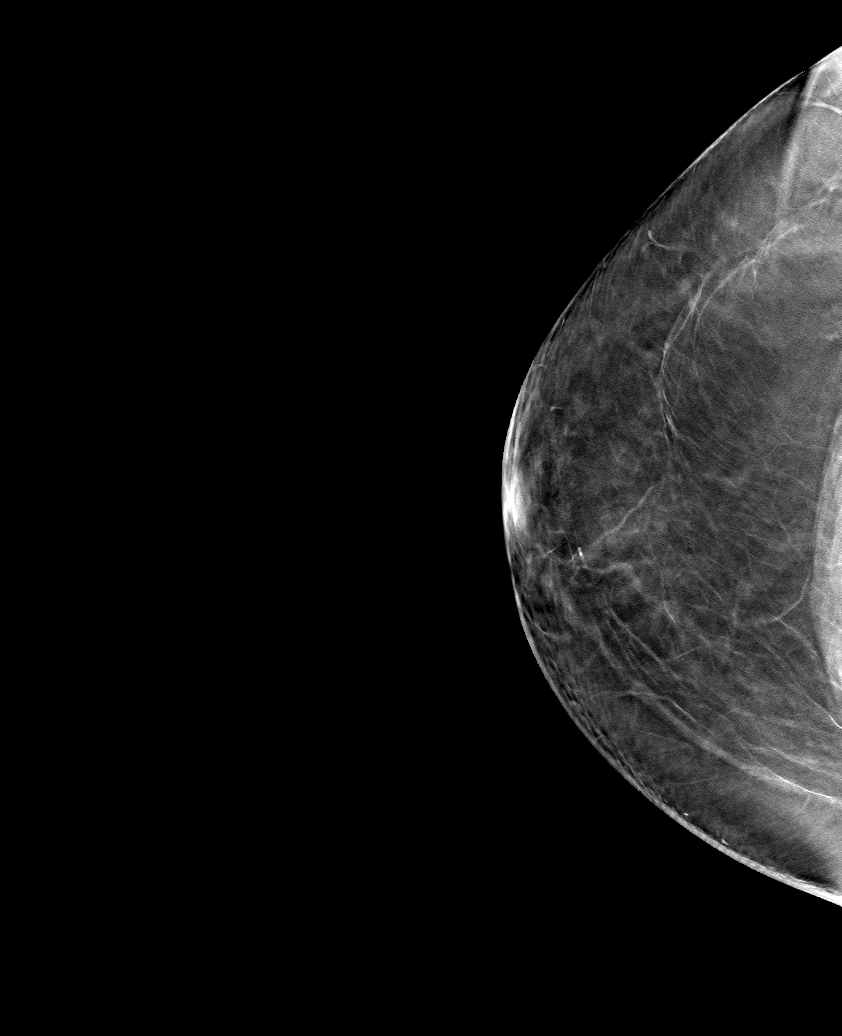

[6 of 30 positions shown; findings below may reference images not displayed]

ACR Breast Density Category b: There are scattered areas of
fibroglandular density.
FINDINGS: There are no findings suspicious for malignancy.
IMPRESSION: No mammographic evidence of malignancy. A result letter of this
screening mammogram will be mailed directly to the patient.

RECOMMENDATION:
Screening mammogram in one year. (Code:51-O-LD2)

BI-RADS CATEGORY  1: Negative.

## 2022-06-12 DIAGNOSIS — M9903 Segmental and somatic dysfunction of lumbar region: Secondary | ICD-10-CM | POA: Diagnosis not present

## 2022-06-12 DIAGNOSIS — M5432 Sciatica, left side: Secondary | ICD-10-CM | POA: Diagnosis not present

## 2022-06-13 DIAGNOSIS — M5432 Sciatica, left side: Secondary | ICD-10-CM | POA: Diagnosis not present

## 2022-06-13 DIAGNOSIS — M9903 Segmental and somatic dysfunction of lumbar region: Secondary | ICD-10-CM | POA: Diagnosis not present

## 2022-06-17 DIAGNOSIS — M5432 Sciatica, left side: Secondary | ICD-10-CM | POA: Diagnosis not present

## 2022-06-17 DIAGNOSIS — M9903 Segmental and somatic dysfunction of lumbar region: Secondary | ICD-10-CM | POA: Diagnosis not present

## 2022-06-18 DIAGNOSIS — M5432 Sciatica, left side: Secondary | ICD-10-CM | POA: Diagnosis not present

## 2022-06-18 DIAGNOSIS — M9903 Segmental and somatic dysfunction of lumbar region: Secondary | ICD-10-CM | POA: Diagnosis not present

## 2022-06-19 DIAGNOSIS — M9903 Segmental and somatic dysfunction of lumbar region: Secondary | ICD-10-CM | POA: Diagnosis not present

## 2022-06-19 DIAGNOSIS — M5432 Sciatica, left side: Secondary | ICD-10-CM | POA: Diagnosis not present

## 2022-06-25 DIAGNOSIS — M9903 Segmental and somatic dysfunction of lumbar region: Secondary | ICD-10-CM | POA: Diagnosis not present

## 2022-06-25 DIAGNOSIS — M5432 Sciatica, left side: Secondary | ICD-10-CM | POA: Diagnosis not present

## 2022-06-27 DIAGNOSIS — M9903 Segmental and somatic dysfunction of lumbar region: Secondary | ICD-10-CM | POA: Diagnosis not present

## 2022-06-27 DIAGNOSIS — M5432 Sciatica, left side: Secondary | ICD-10-CM | POA: Diagnosis not present

## 2022-07-01 DIAGNOSIS — M5432 Sciatica, left side: Secondary | ICD-10-CM | POA: Diagnosis not present

## 2022-07-01 DIAGNOSIS — M9903 Segmental and somatic dysfunction of lumbar region: Secondary | ICD-10-CM | POA: Diagnosis not present

## 2022-07-03 DIAGNOSIS — M5432 Sciatica, left side: Secondary | ICD-10-CM | POA: Diagnosis not present

## 2022-07-03 DIAGNOSIS — M9903 Segmental and somatic dysfunction of lumbar region: Secondary | ICD-10-CM | POA: Diagnosis not present

## 2022-07-08 DIAGNOSIS — M9903 Segmental and somatic dysfunction of lumbar region: Secondary | ICD-10-CM | POA: Diagnosis not present

## 2022-07-08 DIAGNOSIS — M5432 Sciatica, left side: Secondary | ICD-10-CM | POA: Diagnosis not present

## 2022-07-11 DIAGNOSIS — M9903 Segmental and somatic dysfunction of lumbar region: Secondary | ICD-10-CM | POA: Diagnosis not present

## 2022-07-11 DIAGNOSIS — M5432 Sciatica, left side: Secondary | ICD-10-CM | POA: Diagnosis not present

## 2022-07-16 DIAGNOSIS — M9903 Segmental and somatic dysfunction of lumbar region: Secondary | ICD-10-CM | POA: Diagnosis not present

## 2022-07-16 DIAGNOSIS — M5432 Sciatica, left side: Secondary | ICD-10-CM | POA: Diagnosis not present

## 2022-07-30 DIAGNOSIS — M5432 Sciatica, left side: Secondary | ICD-10-CM | POA: Diagnosis not present

## 2022-07-30 DIAGNOSIS — M9903 Segmental and somatic dysfunction of lumbar region: Secondary | ICD-10-CM | POA: Diagnosis not present

## 2022-08-08 ENCOUNTER — Other Ambulatory Visit: Payer: Self-pay | Admitting: Family

## 2022-08-08 DIAGNOSIS — E559 Vitamin D deficiency, unspecified: Secondary | ICD-10-CM

## 2022-08-09 ENCOUNTER — Ambulatory Visit: Payer: Medicare Other | Admitting: Family

## 2022-08-13 ENCOUNTER — Ambulatory Visit (INDEPENDENT_AMBULATORY_CARE_PROVIDER_SITE_OTHER): Payer: PPO | Admitting: Family

## 2022-08-13 ENCOUNTER — Encounter: Payer: Self-pay | Admitting: Family

## 2022-08-13 VITALS — BP 132/77 | HR 78 | Temp 98.0°F | Ht 62.0 in | Wt 199.0 lb

## 2022-08-13 DIAGNOSIS — Z6836 Body mass index (BMI) 36.0-36.9, adult: Secondary | ICD-10-CM

## 2022-08-13 DIAGNOSIS — Z0000 Encounter for general adult medical examination without abnormal findings: Secondary | ICD-10-CM

## 2022-08-13 DIAGNOSIS — Z0001 Encounter for general adult medical examination with abnormal findings: Secondary | ICD-10-CM | POA: Diagnosis not present

## 2022-08-13 DIAGNOSIS — L719 Rosacea, unspecified: Secondary | ICD-10-CM | POA: Diagnosis not present

## 2022-08-13 DIAGNOSIS — E559 Vitamin D deficiency, unspecified: Secondary | ICD-10-CM | POA: Diagnosis not present

## 2022-08-13 DIAGNOSIS — I4510 Unspecified right bundle-branch block: Secondary | ICD-10-CM

## 2022-08-13 DIAGNOSIS — R8290 Unspecified abnormal findings in urine: Secondary | ICD-10-CM | POA: Diagnosis not present

## 2022-08-13 DIAGNOSIS — E7800 Pure hypercholesterolemia, unspecified: Secondary | ICD-10-CM

## 2022-08-13 DIAGNOSIS — L900 Lichen sclerosus et atrophicus: Secondary | ICD-10-CM

## 2022-08-13 DIAGNOSIS — I10 Essential (primary) hypertension: Secondary | ICD-10-CM | POA: Diagnosis not present

## 2022-08-13 DIAGNOSIS — R7303 Prediabetes: Secondary | ICD-10-CM | POA: Diagnosis not present

## 2022-08-13 LAB — CMP14+EGFR

## 2022-08-13 LAB — LIPID PANEL

## 2022-08-13 LAB — CBC WITH DIFFERENTIAL/PLATELET
Basos: 1 %
Hemoglobin: 14.5 g/dL (ref 11.1–15.9)
Immature Grans (Abs): 0 10*3/uL (ref 0.0–0.1)
Lymphocytes Absolute: 1.2 10*3/uL (ref 0.7–3.1)
Lymphs: 19 %
MCH: 29.8 pg (ref 26.6–33.0)
Neutrophils Absolute: 4.5 10*3/uL (ref 1.4–7.0)
Neutrophils: 72 %
RDW: 13.1 % (ref 11.7–15.4)
WBC: 6.2 10*3/uL (ref 3.4–10.8)

## 2022-08-13 LAB — MICROSCOPIC EXAMINATION
Epithelial Cells (non renal): NONE SEEN /hpf (ref 0–10)
Renal Epithel, UA: NONE SEEN /hpf

## 2022-08-13 LAB — URINALYSIS, COMPLETE
Bilirubin, UA: NEGATIVE
Glucose, UA: NEGATIVE
Nitrite, UA: NEGATIVE
Protein,UA: NEGATIVE
Specific Gravity, UA: 1.02 (ref 1.005–1.030)
Urobilinogen, Ur: 0.2 mg/dL (ref 0.2–1.0)
pH, UA: 5.5 (ref 5.0–7.5)

## 2022-08-13 MED ORDER — CLOBETASOL PROPIONATE 0.05 % EX CREA
1.0000 | TOPICAL_CREAM | Freq: Two times a day (BID) | CUTANEOUS | 2 refills | Status: DC
Start: 2022-08-13 — End: 2022-12-03

## 2022-08-13 MED ORDER — METRONIDAZOLE 1 % EX GEL
Freq: Every day | CUTANEOUS | 0 refills | Status: DC
Start: 2022-08-13 — End: 2022-09-25

## 2022-08-13 MED ORDER — LOSARTAN POTASSIUM 50 MG PO TABS: 50.0000 mg | ORAL_TABLET | Freq: Every day | ORAL | 1 refills | Status: DC

## 2022-08-13 NOTE — Patient Instructions (Signed)
Rosacea Rosacea is a long-term (chronic) condition that affects the skin of the face, including the cheeks, nose, forehead, and chin. This condition can also affect the eyes. Rosacea causes blood vessels near the surface of the skin to enlarge, which results in redness. What are the causes? The cause of this condition is not known. Certain triggers can make rosacea worse, including: Exercise. Sunlight. Very hot or cold temperatures. Hot or spicy foods and drinks. Drinking alcohol. Stress. Taking blood pressure medicine. Long-term use of topical steroids on the face. What increases the risk? You are more likely to develop this condition if you: Are older than 68 years of age. Are a woman. Have light-colored skin (light complexion). Have a family history of rosacea. What are the signs or symptoms? Symptoms of this condition include: Redness of the face. Red bumps or pimples on the face. A red, enlarged nose. Blushing easily. Red lines on the skin. Eye problems such as: Irritated, burning, or itchy feeling in the eyes. Swollen eyelids. Drainage from the eyes. Feeling like there is something in your eye. How is this diagnosed? This condition is diagnosed with a medical history and physical exam. How is this treated? There is no cure for this condition, but treatment can help to control your symptoms. Your health care provider may recommend that you see a skin specialist (dermatologist). Treatment may include: Medicines that are applied to the skin or taken by mouth (orally). This can include antibiotic medicines. Laser treatment to improve the appearance of the skin. Surgery. This is rare. Your health care provider will also recommend the best way to take care of your skin. Even after your skin improves, you will likely need to continue treatment to prevent your rosacea from coming back. Follow these instructions at home: Skin care Take care of your skin as told by your health  care provider. You may be told to do these things: Wash your skin gently two or more times each day. Use mild soap. Use a sunscreen or sunblock with SPF 30 or greater. Use gentle cosmetics that are meant for sensitive skin. Shave with an electric shaver instead of a blade. Lifestyle Try to keep track of what foods trigger this condition. Avoid any triggers. These may include: Spicy foods. Seafood. Cheese. Hot liquids. Nuts. Chocolate. Iodized salt. Do not drink alcohol. Avoid extremely cold or hot temperatures. Try to reduce your stress. If you need help, talk with your health care provider. When you exercise, do these things to stay cool: Limit sun exposure to your face. Use a fan. Do shorter and more frequent intervals of exercise. General instructions Take and apply over-the-counter and prescription medicines only as told by your health care provider. If you were prescribed antibiotics, apply it or take them as told by your health care provider. Do not stop using the antibiotic even if your condition improves. If your eyelids are affected, apply warm compresses to them. Do this as told by your health care provider. Keep all follow-up visits. Contact a health care provider if: Your symptoms get worse. Your symptoms do not improve after 2 months of treatment. You have new symptoms. You have any changes in vision or you have problems with your eyes, such as redness or itching. You feel depressed. You lose your appetite. You have trouble concentrating. Summary Rosacea is a long-term (chronic) condition that affects the skin of the face, including the cheeks, nose, forehead, and chin. Take care of your skin as told by your health care   provider. Take and apply over-the-counter and prescription medicines only as told by your health care provider. Contact a health care provider if your symptoms get worse or if you have any changes in vision or other problems with your eyes, such as  redness or itching. Keep all follow-up visits. This information is not intended to replace advice given to you by your health care provider. Make sure you discuss any questions you have with your health care provider. Document Revised: 06/13/2021 Document Reviewed: 06/13/2021 Elsevier Patient Education  2023 [employer].  

## 2022-08-13 NOTE — Progress Notes (Signed)
Subjective:    Patient ID: Tammy Lawson, female    DOB: 1956/02/10, 67 y.o.   MRN: [id number]  Chief Complaint  Patient presents with   Medical Management of Chronic Issues    HAD A FALL IN LAST JUNE AND FRACTURED BACK.  VARICOSE VEINS. PLACE ON LEFT BIG TOE.    Right bundle branch block.    DOES SHE NEED CARDIOLOGY    URINE ODOR    SPOTS ON FACE   PT presents to the office today for CPE without pap She is considered morbid obese with a BMI of 36 and co morbility of HTN.     She reports she was told she had right bundle branch block and requesting referral to Cardiologists.   Complaining of increased redness and breakouts on face that comes and goes.  Hypertension This is a chronic problem. The current episode started more than 1 year ago. The problem has been resolved since onset. The problem is controlled. Pertinent negatives include no blurred vision, malaise/fatigue, peripheral edema or shortness of breath. Risk factors for coronary artery disease include dyslipidemia and obesity. The current treatment provides moderate improvement.  Hyperlipidemia This is a chronic problem. The current episode started more than 1 year ago. Exacerbating diseases include obesity. Pertinent negatives include no shortness of breath. Current antihyperlipidemic treatment includes statins. The current treatment provides moderate improvement of lipids. Risk factors for coronary artery disease include diabetes mellitus, dyslipidemia, hypertension, a sedentary lifestyle and post-menopausal.  Diabetes She presents for her follow-up diabetic visit. Diabetes type: prediabetes. Her disease course has been stable. Pertinent negatives for diabetes include no blurred vision and no foot paresthesias. Symptoms are stable. Risk factors for coronary artery disease include dyslipidemia, diabetes mellitus, hypertension, sedentary lifestyle and post-menopausal. She is following a diabetic diet. Her overall blood glucose range  is 90-110 mg/dl.  Urinary Frequency  This is a new problem. The current episode started 1 to 4 weeks ago. The problem occurs intermittently. The problem has been waxing and waning. The patient is experiencing no pain. Associated symptoms include frequency, nausea and urgency. Pertinent negatives include no hematuria or vomiting. Associated symptoms comments: Urine odor. She has tried increased fluids for the symptoms. The treatment provided mild relief.      Review of Systems  Constitutional:  Negative for malaise/fatigue.  Eyes:  Negative for blurred vision.  Respiratory:  Negative for shortness of breath.   Gastrointestinal:  Positive for nausea. Negative for vomiting.  Genitourinary:  Positive for frequency and urgency. Negative for hematuria.  All other systems reviewed and are negative.      Objective:   Physical Exam Vitals reviewed.  Constitutional:      General: She is not in acute distress.    Appearance: She is well-developed. She is obese.  HENT:     Head: Normocephalic and atraumatic.     Right Ear: Tympanic membrane normal.     Left Ear: Tympanic membrane normal.  Eyes:     Pupils: Pupils are equal, round, and reactive to light.  Neck:     Thyroid: No thyromegaly.  Cardiovascular:     Rate and Rhythm: Normal rate and regular rhythm.     Heart sounds: Normal heart sounds. No murmur heard. Pulmonary:     Effort: Pulmonary effort is normal. No respiratory distress.     Breath sounds: Normal breath sounds. No wheezing.  Abdominal:     General: Bowel sounds are normal. There is no distension.     Palpations: Abdomen  is soft.     Tenderness: There is no abdominal tenderness.  Musculoskeletal:        General: No tenderness. Normal range of motion.     Cervical back: Normal range of motion and neck supple.  Skin:    General: Skin is warm and dry.  Neurological:     Mental Status: She is alert and oriented to person, place, and time.     Cranial Nerves: No cranial  nerve deficit.     Deep Tendon Reflexes: Reflexes are normal and symmetric.  Psychiatric:        Behavior: Behavior normal.        Thought Content: Thought content normal.        Judgment: Judgment normal.          BP 132/77   Pulse 78   Temp 98 F (36.7 C) (Temporal)   Ht 5\' 2"  (1.575 m)   Wt 199 lb (90.3 kg)   SpO2 100%   BMI 36.40 kg/m   Assessment & Plan:  [name] comes in today with chief complaint of Medical Management of Chronic Issues (HAD A FALL IN LAST JUNE AND FRACTURED BACK.  VARICOSE VEINS. PLACE ON LEFT BIG TOE. ), Right bundle branch block. (DOES SHE NEED CARDIOLOGY ), URINE ODOR , and SPOTS ON FACE   Diagnosis and orders addressed:  1. Essential hypertension - losartan (COZAAR) 50 MG tablet; Take 1 tablet (50 mg total) by mouth daily.  Dispense: 90 tablet; Refill: 1 - CMP14+EGFR - CBC with Differential/Platelet  2. Abnormal urine odor - Urine Culture - Urinalysis, Complete - CMP14+EGFR - CBC with Differential/Platelet  3. Prediabetes - CMP14+EGFR - CBC with Differential/Platelet  4. RBBB - Ambulatory referral to Cardiology - CMP14+EGFR - CBC with Differential/Platelet  5. Vitamin D deficiency - CMP14+EGFR - CBC with Differential/Platelet  6. Pure hypercholesterolemia - CMP14+EGFR - CBC with Differential/Platelet  7. Morbid obesity - CMP14+EGFR - CBC with Differential/Platelet  8. Rosacea Start metrogel  Avoid triggers - metroNIDAZOLE (METROGEL) 1 % gel; Apply topically daily.  Dispense: 45 g; Refill: 0 - CMP14+EGFR - CBC with Differential/Platelet  9. Annual physical exam - Urine Culture - Urinalysis, Complete - CMP14+EGFR - CBC with Differential/Platelet - Lipid panel - TSH  10. Lichen sclerosus - clobetasol cream (TEMOVATE) 0.05 %; Apply 1 Application topically 2 (two) times daily.  Dispense: 60 g; Refill: 2   Labs pending Health Maintenance reviewed Diet and exercise encouraged  Follow up plan: 6 months     Jannifer Rodney, FNP

## 2022-08-14 DIAGNOSIS — M9903 Segmental and somatic dysfunction of lumbar region: Secondary | ICD-10-CM | POA: Diagnosis not present

## 2022-08-14 DIAGNOSIS — M5432 Sciatica, left side: Secondary | ICD-10-CM | POA: Diagnosis not present

## 2022-08-14 LAB — CBC WITH DIFFERENTIAL/PLATELET
Basophils Absolute: 0 10*3/uL (ref 0.0–0.2)
EOS (ABSOLUTE): 0.1 10*3/uL (ref 0.0–0.4)
Eos: 2 %
Hematocrit: 45.3 % (ref 34.0–46.6)
Immature Granulocytes: 0 %
MCHC: 32 g/dL (ref 31.5–35.7)
MCV: 93 fL (ref 79–97)
Monocytes Absolute: 0.4 10*3/uL (ref 0.1–0.9)
Monocytes: 6 %
Platelets: 221 10*3/uL (ref 150–450)
RBC: 4.87 x10E6/uL (ref 3.77–5.28)

## 2022-08-14 LAB — LIPID PANEL
Cholesterol, Total: 127 mg/dL (ref 100–199)
HDL: 48 mg/dL (ref >39)
LDL Chol Calc (NIH): 59 mg/dL (ref 0–99)
VLDL Cholesterol Cal: 20 mg/dL (ref 5–40)

## 2022-08-14 LAB — CMP14+EGFR
Albumin/Globulin Ratio: 1.9 (ref 1.2–2.2)
Alkaline Phosphatase: 77 IU/L (ref 44–121)
Bilirubin Total: 0.6 mg/dL (ref 0.0–1.2)
Calcium: 9.9 mg/dL (ref 8.7–10.3)
Chloride: 103 mmol/L (ref 96–106)
Creatinine, Ser: 0.69 mg/dL (ref 0.57–1.00)
Globulin, Total: 2.5 g/dL (ref 1.5–4.5)
Potassium: 4.6 mmol/L (ref 3.5–5.2)
Sodium: 142 mmol/L (ref 134–144)
Total Protein: 7.3 g/dL (ref 6.0–8.5)

## 2022-08-14 LAB — TSH: TSH: 0.778 u[IU]/mL (ref 0.450–4.500)

## 2022-08-15 ENCOUNTER — Telehealth: Payer: Self-pay | Admitting: Family

## 2022-08-15 ENCOUNTER — Other Ambulatory Visit: Payer: Self-pay | Admitting: Family

## 2022-08-15 LAB — URINE CULTURE

## 2022-08-15 MED ORDER — CEPHALEXIN 500 MG PO CAPS: 500.0000 mg | ORAL_CAPSULE | Freq: Two times a day (BID) | ORAL | 0 refills | Status: DC

## 2022-08-15 NOTE — Telephone Encounter (Signed)
Rx sent to [facility] and cancelled at [facility]. Patient aware.

## 2022-08-16 ENCOUNTER — Other Ambulatory Visit: Payer: Self-pay | Admitting: Family

## 2022-08-16 DIAGNOSIS — Z1231 Encounter for screening mammogram for malignant neoplasm of breast: Secondary | ICD-10-CM

## 2022-08-28 ENCOUNTER — Ambulatory Visit
Admission: RE | Admit: 2022-08-28 | Discharge: 2022-08-28 | Disposition: A | Discharge status: Home / Self Care | Payer: Self-pay | Source: Ambulatory Visit | Attending: Family | Admitting: Family

## 2022-08-28 DIAGNOSIS — Z1231 Encounter for screening mammogram for malignant neoplasm of breast: Secondary | ICD-10-CM

## 2022-09-04 DIAGNOSIS — M5432 Sciatica, left side: Secondary | ICD-10-CM | POA: Diagnosis not present

## 2022-09-04 DIAGNOSIS — M9903 Segmental and somatic dysfunction of lumbar region: Secondary | ICD-10-CM | POA: Diagnosis not present

## 2022-09-13 ENCOUNTER — Ambulatory Visit (INDEPENDENT_AMBULATORY_CARE_PROVIDER_SITE_OTHER): Payer: PPO

## 2022-09-13 VITALS — Ht 62.0 in | Wt 196.0 lb

## 2022-09-13 DIAGNOSIS — Z0000 Encounter for general adult medical examination without abnormal findings: Secondary | ICD-10-CM

## 2022-09-13 NOTE — Patient Instructions (Signed)
Tammy Lawson , Thank you for taking time to come for your Medicare Wellness Visit. I appreciate your ongoing commitment to your health goals. Please review the following plan we discussed and let me know if I can assist you in the future.   These are the goals we discussed:  Goals      Patient Stated     Continue exercising, staying active, eating healthy and lose some weight         This is a list of the screening recommended for you and due dates:  Health Maintenance  Topic Date Due   COVID-19 Vaccine (3 - 2023-24 season) 09/29/2022*   Zoster (Shingles) Vaccine (1 of 2) 11/12/2022*   Pneumonia Vaccine (2 of 2 - PPSV23 or PCV20) 08/13/2023*   Flu Shot  12/05/2022   Medicare Annual Wellness Visit  09/13/2023   DEXA scan (bone density measurement)  02/09/2024   Mammogram  08/27/2024   Colon Cancer Screening  06/03/2027   DTaP/Tdap/Td vaccine (2 - Td or Tdap) 01/19/2029   Hepatitis C Screening: USPSTF Recommendation to screen - Ages 27-79 yo.  Completed   HPV Vaccine  Aged Out  *Topic was postponed. The date shown is not the original due date.    Advanced directives: Advance directive discussed with you today. I have provided a copy for you to complete at home and have notarized. Once this is complete please bring a copy in to our office so we can scan it into your chart.   Conditions/risks identified: Aim for 30 minutes of exercise or brisk walking, 6-8 glasses of water, and 5 servings of fruits and vegetables each day.   Next appointment: Follow up in one year for your annual wellness visit    Preventive Care 65 Years and Older, Female Preventive care refers to lifestyle choices and visits with your health care provider that can promote health and wellness. What does preventive care include? A yearly physical exam. This is also called an annual well check. Dental exams once or twice a year. Routine eye exams. Ask your health care provider how often you should have your eyes  checked. Personal lifestyle choices, including: Daily care of your teeth and gums. Regular physical activity. Eating a healthy diet. Avoiding tobacco and drug use. Limiting alcohol use. Practicing safe sex. Taking low-dose aspirin every day. Taking vitamin and mineral supplements as recommended by your health care provider. What happens during an annual well check? The services and screenings done by your health care provider during your annual well check will depend on your age, overall health, lifestyle risk factors, and family history of disease. Counseling  Your health care provider may ask you questions about your: Alcohol use. Tobacco use. Drug use. Emotional well-being. Home and relationship well-being. Sexual activity. Eating habits. History of falls. Memory and ability to understand (cognition). Work and work Astronomer. Reproductive health. Screening  You may have the following tests or measurements: Height, weight, and BMI. Blood pressure. Lipid and cholesterol levels. These may be checked every 5 years, or more frequently if you are over 57 years old. Skin check. Lung cancer screening. You may have this screening every year starting at age 74 if you have a 30-pack-year history of smoking and currently smoke or have quit within the past 15 years. Fecal occult blood test (FOBT) of the stool. You may have this test every year starting at age 17. Flexible sigmoidoscopy or colonoscopy. You may have a sigmoidoscopy every 5 years or a colonoscopy every  10 years starting at age 38. Hepatitis C blood test. Hepatitis B blood test. Sexually transmitted disease (STD) testing. Diabetes screening. This is done by checking your blood sugar (glucose) after you have not eaten for a while (fasting). You may have this done every 1-3 years. Bone density scan. This is done to screen for osteoporosis. You may have this done starting at age 85. Mammogram. This may be done every 1-2  years. Talk to your health care provider about how often you should have regular mammograms. Talk with your health care provider about your test results, treatment options, and if necessary, the need for more tests. Vaccines  Your health care provider may recommend certain vaccines, such as: Influenza vaccine. This is recommended every year. Tetanus, diphtheria, and acellular pertussis (Tdap, Td) vaccine. You may need a Td booster every 10 years. Zoster vaccine. You may need this after age 36. Pneumococcal 13-valent conjugate (PCV13) vaccine. One dose is recommended after age 41. Pneumococcal polysaccharide (PPSV23) vaccine. One dose is recommended after age 67. Talk to your health care provider about which screenings and vaccines you need and how often you need them. This information is not intended to replace advice given to you by your health care provider. Make sure you discuss any questions you have with your health care provider. Document Released: 05/19/2015 Document Revised: 01/10/2016 Document Reviewed: 02/21/2015 Elsevier Interactive Patient Education  2017 [place] Prevention in the Home Falls can cause injuries. They can happen to people of all ages. There are many things you can do to make your home safe and to help prevent falls. What can I do on the outside of my home? Regularly fix the edges of walkways and driveways and fix any cracks. Remove anything that might make you trip as you walk through a door, such as a raised step or threshold. Trim any bushes or trees on the path to your home. Use bright outdoor lighting. Clear any walking paths of anything that might make someone trip, such as rocks or tools. Regularly check to see if handrails are loose or broken. Make sure that both sides of any steps have handrails. Any raised decks and porches should have guardrails on the edges. Have any leaves, snow, or ice cleared regularly. Use sand or salt on walking paths  during winter. Clean up any spills in your garage right away. This includes oil or grease spills. What can I do in the bathroom? Use night lights. Install grab bars by the toilet and in the tub and shower. Do not use towel bars as grab bars. Use non-skid mats or decals in the tub or shower. If you need to sit down in the shower, use a plastic, non-slip stool. Keep the floor dry. Clean up any water that spills on the floor as soon as it happens. Remove soap buildup in the tub or shower regularly. Attach bath mats securely with double-sided non-slip rug tape. Do not have throw rugs and other things on the floor that can make you trip. What can I do in the bedroom? Use night lights. Make sure that you have a light by your bed that is easy to reach. Do not use any sheets or blankets that are too big for your bed. They should not hang down onto the floor. Have a firm chair that has side arms. You can use this for support while you get dressed. Do not have throw rugs and other things on the floor that can make you  trip. What can I do in the kitchen? Clean up any spills right away. Avoid walking on wet floors. Keep items that you use a lot in easy-to-reach places. If you need to reach something above you, use a strong step stool that has a grab bar. Keep electrical cords out of the way. Do not use floor polish or wax that makes floors slippery. If you must use wax, use non-skid floor wax. Do not have throw rugs and other things on the floor that can make you trip. What can I do with my stairs? Do not leave any items on the stairs. Make sure that there are handrails on both sides of the stairs and use them. Fix handrails that are broken or loose. Make sure that handrails are as long as the stairways. Check any carpeting to make sure that it is firmly attached to the stairs. Fix any carpet that is loose or worn. Avoid having throw rugs at the top or bottom of the stairs. If you do have throw  rugs, attach them to the floor with carpet tape. Make sure that you have a light switch at the top of the stairs and the bottom of the stairs. If you do not have them, ask someone to add them for you. What else can I do to help prevent falls? Wear shoes that: Do not have high heels. Have rubber bottoms. Are comfortable and fit you well. Are closed at the toe. Do not wear sandals. If you use a stepladder: Make sure that it is fully opened. Do not climb a closed stepladder. Make sure that both sides of the stepladder are locked into place. Ask someone to hold it for you, if possible. Clearly mark and make sure that you can see: Any grab bars or handrails. First and last steps. Where the edge of each step is. Use tools that help you move around (mobility aids) if they are needed. These include: Canes. Walkers. Scooters. Crutches. Turn on the lights when you go into a dark area. Replace any light bulbs as soon as they burn out. Set up your furniture so you have a clear path. Avoid moving your furniture around. If any of your floors are uneven, fix them. If there are any pets around you, be aware of where they are. Review your medicines with your doctor. Some medicines can make you feel dizzy. This can increase your chance of falling. Ask your doctor what other things that you can do to help prevent falls. This information is not intended to replace advice given to you by your health care provider. Make sure you discuss any questions you have with your health care provider. Document Released: 02/16/2009 Document Revised: 09/28/2015 Document Reviewed: 05/27/2014 Elsevier Interactive Patient Education  2017 [employer].

## 2022-09-13 NOTE — Progress Notes (Signed)
Subjective:   Tammy Lawson is a 67 y.o. female who presents for Medicare Annual (Subsequent) preventive examination. I connected with  Tammy Lawson on 09/13/22 by a audio enabled telemedicine application and verified that I am speaking with the correct person using two identifiers.  Patient Location: Home  Provider Location: Home Office  I discussed the limitations of evaluation and management by telemedicine. The patient expressed understanding and agreed to proceed.  Review of Systems     Cardiac Risk Factors include: advanced age (>59men, >29 women);dyslipidemia     Objective:    Today's Vitals   09/13/22 1322  Weight: 196 lb (88.9 kg)  Height: 5\' 2"  (1.575 m)   Body mass index is 35.85 kg/m.     09/13/2022    1:26 PM 09/11/2021    1:25 PM 09/07/2020    9:12 AM 06/02/2020   10:03 AM  Advanced Directives  Does Patient Have a Medical Advance Directive? No No No No  Would patient like information on creating a medical advance directive? No - Patient declined Yes (MAU/Ambulatory/Procedural Areas - Information given) No - Patient declined No - Patient declined    Current Medications (verified) Outpatient Encounter Medications as of 09/13/2022  Medication Sig   Ascorbic Acid (VITAMIN C) 100 MG tablet Take 100 mg by mouth daily.   CALCIUM-MAGNESIUM-ZINC PO Take 1 capsule by mouth once a week.   clobetasol cream (TEMOVATE) 0.05 % Apply 1 Application topically 2 (two) times daily.   fluticasone (FLONASE) 50 MCG/ACT nasal spray SHAKE LIQUID AND USE 2 SPRAYS IN EACH NOSTRIL DAILY   losartan (COZAAR) 50 MG tablet Take 1 tablet (50 mg total) by mouth daily.   metroNIDAZOLE (METROGEL) 1 % gel Apply topically daily.   rosuvastatin (CRESTOR) 10 MG tablet TAKE 1 TABLET(10 MG) BY MOUTH DAILY   Vitamin D, Ergocalciferol, (DRISDOL) 1.25 MG (50000 UNIT) CAPS capsule TAKE 1 CAPSULE BY MOUTH EVERY 7 DAYS   cephALEXin (KEFLEX) 500 MG capsule Take 1 capsule (500 mg total) by mouth 2 (two) times  daily.   No facility-administered encounter medications on file as of 09/13/2022.    Allergies (verified) Other and Augmentin [amoxicillin-pot clavulanate]   History: Past Medical History:  Diagnosis Date   Cataract    HLD (hyperlipidemia)    HTN (hypertension)    Osteopenia    Vitamin D deficiency    Past Surgical History:  Procedure Laterality Date   NO PAST SURGERIES     Family History  Problem Relation Age of Onset   Diabetes Mother    Heart disease Mother    Stroke Mother    Hypertension Mother    Hyperlipidemia Mother    COPD Father    Rectal cancer Father    Colon polyps Father    Atrial fibrillation Father    Hyperlipidemia Father    Prostate cancer Father    Lung disease Father        tumor on lung   Cancer Sister        Mets and don't know origin   Albinism Sister    Pulmonary fibrosis Sister    Other Sister        Hermansky pudlac syndrome   Diabetes Paternal Grandmother    Diabetes Son    Hypertension Son    Hyperlipidemia Son    Esophageal cancer Neg Hx    Stomach cancer Neg Hx    Breast cancer Neg Hx    Social History   Socioeconomic History   Marital  status: Married    Spouse name: Tammy Lawson   Number of children: 1   Years of education: 12   Highest education level: 12th grade  Occupational History   Occupation: retired  Tobacco Use   Smoking status: Never   Smokeless tobacco: Never  Vaping Use   Vaping Use: Never used  Substance and Sexual Activity   Alcohol use: No   Drug use: No   Sexual activity: Yes  Other Topics Concern   Not on file  Social History Narrative   Son lives 25 miles away   Lives with her husband in one level home   Social Determinants of Health   Financial Resource Strain: Low Risk  (09/13/2022)   Overall Financial Resource Strain (CARDIA)    Difficulty of Paying Living Expenses: Not hard at all  Food Insecurity: No Food Insecurity (09/13/2022)   Hunger Vital Sign    Worried About Running Out of Food in  the Last Year: Never true    Ran Out of Food in the Last Year: Never true  Transportation Needs: No Transportation Needs (09/13/2022)   PRAPARE - Administrator, Civil Service (Medical): No    Lack of Transportation (Non-Medical): No  Physical Activity: Sufficiently Active (09/13/2022)   Exercise Vital Sign    Days of Exercise per Week: 6 days    Minutes of Exercise per Session: 40 min  Stress: No Stress Concern Present (09/13/2022)   [employer] of Occupational Health - Occupational Stress Questionnaire    Feeling of Stress : Not at all  Social Connections: Moderately Integrated (09/13/2022)   Social Connection and Isolation Panel [NHANES]    Frequency of Communication with Friends and Family: More than three times a week    Frequency of Social Gatherings with Friends and Family: More than three times a week    Attends Religious Services: More than 4 times per year    Active Member of [employer] or Organizations: No    Attends Engineer, structural: Never    Marital Status: Married    Tobacco Counseling Counseling given: Not Answered   Clinical Intake:  Pre-visit preparation completed: Yes  Pain : No/denies pain     Nutritional Risks: None Diabetes: No  How often do you need to have someone help you when you read instructions, pamphlets, or other written materials from your doctor or pharmacy?: 1 - Never  Diabetic?no   Interpreter Needed?: No  Information entered by :: Renie Ora, LPN   Activities of Daily Living    09/13/2022    1:26 PM 09/10/2022   12:42 PM  In your present state of health, do you have any difficulty performing the following activities:  Hearing? 0 0  Vision? 0 0  Difficulty concentrating or making decisions? 0 0  Walking or climbing stairs? 0 0  Dressing or bathing? 0 0  Doing errands, shopping? 0 0  Preparing Food and eating ? N N  Using the Toilet? N N  In the past six months, have you accidently leaked urine? N Y  Do  you have problems with loss of bowel control? N N  Managing your Medications? N N  Managing your Finances? N N  Housekeeping or managing your Housekeeping? N N    Patient Care Team: Junie Spencer, FNP as PCP - General (Family Medicine) Napoleon Form, MD as Consulting Physician (Gastroenterology) Jill Side, OD as Referring Physician (Optometry)  Indicate any recent Medical Services you may have received  from other than [facility] providers in the past year (date may be approximate).     Assessment:   This is a routine wellness examination for Tammy Lawson.  Hearing/Vision screen Vision Screening - Comments:: Wears rx glasses - up to date with routine eye exams with  Dr.barts   Dietary issues and exercise activities discussed: Current Exercise Habits: Home exercise routine, Type of exercise: walking, Time (Minutes): 40, Frequency (Times/Week): 6, Weekly Exercise (Minutes/Week): 240, Intensity: Mild, Exercise limited by: orthopedic condition(s)   Goals Addressed             This Visit's Progress    Patient Stated   On track    Continue exercising, staying active, eating healthy and lose some weight        Depression Screen    09/13/2022    1:25 PM 08/13/2022    9:24 AM 02/08/2022    9:40 AM 09/11/2021    1:22 PM 02/08/2021    8:38 AM 09/07/2020    9:09 AM 08/08/2020    9:38 AM  PHQ 2/9 Scores  PHQ - 2 Score 0 0 0 0 0 0 0  PHQ- 9 Score 0 0 0  0      Fall Risk    09/13/2022    1:24 PM 09/10/2022   12:42 PM 08/13/2022    9:22 AM 02/08/2022    9:22 AM 09/11/2021    1:19 PM  Fall Risk   Falls in the past year? 0 1 1 1  0  Number falls in past yr: 0 0 0 0 0  Injury with Fall? 0 1 1 0 0  Risk for fall due to : No Fall Risks  Impaired balance/gait Other (Comment) No Fall Risks  Follow up Falls prevention discussed  Falls evaluation completed;Education provided Falls evaluation completed Falls prevention discussed    FALL RISK PREVENTION PERTAINING TO THE HOME:  Any stairs in or  around the home? Yes  If so, are there any without handrails? No  Home free of loose throw rugs in walkways, pet beds, electrical cords, etc? Yes  Adequate lighting in your home to reduce risk of falls? Yes   ASSISTIVE DEVICES UTILIZED TO PREVENT FALLS:  Life alert? No  Use of a cane, walker or w/c? Yes  Grab bars in the bathroom? No  Shower chair or bench in shower? No  Elevated toilet seat or a handicapped toilet? No       09/07/2020    9:12 AM  MMSE - Mini Mental State Exam  Orientation to time 5  Orientation to Place 2  Registration 3  Attention/ Calculation 5  Recall 3  Language- name 2 objects 2  Language- repeat 1  Language- follow 3 step command 3  Language- read & follow direction 1  Write a sentence 1  Copy design 1  Total score 27        09/13/2022    1:26 PM 09/11/2021    1:24 PM  6CIT Screen  What Year? 0 points 0 points  What month? 0 points 0 points  What time? 0 points 0 points  Count back from 20 0 points 0 points  Months in reverse 0 points 0 points  Repeat phrase 0 points 0 points  Total Score 0 points 0 points    Immunizations Immunization History  Administered Date(s) Administered   Fluad Quad(high Dose 65+) 02/08/2021, 02/08/2022   PFIZER(Purple Top)SARS-COV-2 Vaccination 11/08/2019, 11/29/2019   Pneumococcal Conjugate-13 09/07/2020   Tdap 01/20/2019  TDAP status: Up to date  Flu Vaccine status: Up to date  Pneumococcal vaccine status: Up to date  Covid-19 vaccine status: Completed vaccines  Qualifies for Shingles Vaccine? Yes   Zostavax completed No   Shingrix Completed?: No.    Education has been provided regarding the importance of this vaccine. Patient has been advised to call insurance company to determine out of pocket expense if they have not yet received this vaccine. Advised may also receive vaccine at local pharmacy or Health Dept. Verbalized acceptance and understanding.  Screening Tests Health Maintenance  Topic Date  Due   COVID-19 Vaccine (3 - 2023-24 season) 09/29/2022 (Originally 01/04/2022)   Zoster Vaccines- Shingrix (1 of 2) 11/12/2022 (Originally 10/09/2004)   Pneumonia Vaccine 34+ Years old (2 of 2 - PPSV23 or PCV20) 08/13/2023 (Originally 09/07/2021)   INFLUENZA VACCINE  12/05/2022   Medicare Annual Wellness (AWV)  09/13/2023   DEXA SCAN  02/09/2024   MAMMOGRAM  08/27/2024   COLONOSCOPY (Pts 45-65yrs Insurance coverage will need to be confirmed)  06/03/2027   DTaP/Tdap/Td (2 - Td or Tdap) 01/19/2029   Hepatitis C Screening  Completed   HPV VACCINES  Aged Out    Health Maintenance  There are no preventive care reminders to display for this patient.   Colorectal cancer screening: Type of screening: Colonoscopy. Completed 06/02/2020. Repeat every 7 years  Mammogram status: Completed 08/28/2022. Repeat every year  Bone Density status: Completed 02/08/2022. Results reflect: Bone density results: OSTEOPOROSIS. Repeat every 2 years.  Lung Cancer Screening: (Low Dose CT Chest recommended if Age 75-80 years, 30 pack-year currently smoking OR have quit w/in 15years.) does not qualify.   Lung Cancer Screening Referral: n/a  Additional Screening:  Hepatitis C Screening: does not qualify; Completed 07/24/2016  Vision Screening: Recommended annual ophthalmology exams for early detection of glaucoma and other disorders of the eye. Is the patient up to date with their annual eye exam?  Yes  Who is the provider or what is the name of the office in which the patient attends annual eye exams? Dr.Barts  If pt is not established with a provider, would they like to be referred to a provider to establish care? No .   Dental Screening: Recommended annual dental exams for proper oral hygiene  Community Resource Referral / Chronic Care Management: CRR required this visit?  No   CCM required this visit?  No      Plan:     I have personally reviewed and noted the following in the patient's chart:    Medical and social history Use of alcohol, tobacco or illicit drugs  Current medications and supplements including opioid prescriptions. Patient is not currently taking opioid prescriptions. Functional ability and status Nutritional status Physical activity Advanced directives List of other physicians Hospitalizations, surgeries, and ER visits in previous 12 months Vitals Screenings to include cognitive, depression, and falls Referrals and appointments  In addition, I have reviewed and discussed with patient certain preventive protocols, quality metrics, and best practice recommendations. A written personalized care plan for preventive services as well as general preventive health recommendations were provided to patient.     Lorrene Reid, LPN   [id number]   Nurse Notes: none

## 2022-09-22 NOTE — Progress Notes (Unsigned)
Cardiology Office Note:   Date:  09/25/2022  NAME:  Tammy Lawson    MRN: [id number] DOB:  1956-04-21   PCP:  Junie Spencer, FNP  Cardiologist:  None  Electrophysiologist:  None   Referring MD: Junie Spencer, FNP   Chief Complaint  Patient presents with   Abnormal ECG   History of Present Illness:   Tammy Lawson is a 67 y.o. female with a hx of HTN who is being seen today for the evaluation of RBBB at the request of Junie Spencer, FNP.  She reports she was found to have a right bundle branch block on EKG.  Referred for further evaluation.  EKG today does confirm right bundle branch block.  She reports no chest pains or trouble breathing.  She has high blood pressure that is well-controlled.  She has hyperlipidemia that is well-controlled.  She does have a family history of heart disease in her father.  Lipids are very well-controlled.  She is not diabetic.  No dizziness or lightheadedness is reported.  She is really without any symptoms.  Overall doing quite well.  She does not smoke.  No alcohol or drug use is reported.  She is a retired Engineer, site.  She has 1 daughter and 2 grandchildren.  She reports she is mainly a caregiver for her grandchildren these days.  Without any symptoms.  CV exam normal.  T chol 127, HDL 48, LDL 59, TG 106  Past Medical History: Past Medical History:  Diagnosis Date   Cataract    HLD (hyperlipidemia)    HTN (hypertension)    Osteopenia    Vitamin D deficiency     Past Surgical History: Past Surgical History:  Procedure Laterality Date   NO PAST SURGERIES      Current Medications: Current Meds  Medication Sig   Ascorbic Acid (VITAMIN C) 100 MG tablet Take 100 mg by mouth daily.   CALCIUM-MAGNESIUM-ZINC PO Take 1 capsule by mouth once a week.   clobetasol cream (TEMOVATE) 0.05 % Apply 1 Application topically 2 (two) times daily.   fluticasone (FLONASE) 50 MCG/ACT nasal spray SHAKE LIQUID AND USE 2 SPRAYS IN EACH NOSTRIL DAILY    losartan (COZAAR) 50 MG tablet Take 1 tablet (50 mg total) by mouth daily.   rosuvastatin (CRESTOR) 10 MG tablet TAKE 1 TABLET(10 MG) BY MOUTH DAILY   Vitamin D, Ergocalciferol, (DRISDOL) 1.25 MG (50000 UNIT) CAPS capsule TAKE 1 CAPSULE BY MOUTH EVERY 7 DAYS     Allergies:    Other and Augmentin [amoxicillin-pot clavulanate]   Social History: Social History   Socioeconomic History   Marital status: Married    Spouse name: Justin Mend   Number of children: 1   Years of education: 12   Highest education level: 12th grade  Occupational History   Occupation: retired   Occupation: Retired Engineer, site  Tobacco Use   Smoking status: Never   Smokeless tobacco: Never  Building services engineer Use: Never used  Substance and Sexual Activity   Alcohol use: No   Drug use: No   Sexual activity: Yes  Other Topics Concern   Not on file  Social History Narrative   Son lives 25 miles away   Lives with her husband in one level home   Social Determinants of Health   Financial Resource Strain: Low Risk  (09/13/2022)   Overall Financial Resource Strain (CARDIA)    Difficulty of Paying Living Expenses: Not hard at all  Food Insecurity:  No Food Insecurity (09/13/2022)   Hunger Vital Sign    Worried About Running Out of Food in the Last Year: Never true    Ran Out of Food in the Last Year: Never true  Transportation Needs: No Transportation Needs (09/13/2022)   PRAPARE - Administrator, Civil Service (Medical): No    Lack of Transportation (Non-Medical): No  Physical Activity: Sufficiently Active (09/13/2022)   Exercise Vital Sign    Days of Exercise per Week: 6 days    Minutes of Exercise per Session: 40 min  Stress: No Stress Concern Present (09/13/2022)   [employer] of Occupational Health - Occupational Stress Questionnaire    Feeling of Stress : Not at all  Social Connections: Moderately Integrated (09/13/2022)   Social Connection and Isolation Panel [NHANES]    Frequency  of Communication with Friends and Family: More than three times a week    Frequency of Social Gatherings with Friends and Family: More than three times a week    Attends Religious Services: More than 4 times per year    Active Member of [employer] or Organizations: No    Attends Engineer, structural: Never    Marital Status: Married     Family History: The patient's family history includes Albinism in her sister; Arrhythmia in her father; Atrial fibrillation in her father; COPD in her father; Cancer in her sister; Colon polyps in her father; Diabetes in her mother, paternal grandmother, and son; Heart disease in her mother; Hyperlipidemia in her father, mother, and son; Hypertension in her mother and son; Lung disease in her father; Other in her sister; Prostate cancer in her father; Pulmonary fibrosis in her sister; Rectal cancer in her father; Stroke in her mother. There is no history of Esophageal cancer, Stomach cancer, or Breast cancer.  ROS:   All other ROS reviewed and negative. Pertinent positives noted in the HPI.     EKGs/Labs/Other Studies Reviewed:   The following studies were personally reviewed by me today:  EKG:  EKG is ordered today.  The ekg ordered today demonstrates normal sinus rhythm heart rate 88, right bundle branch block, and was personally reviewed by me.   Recent Labs: 08/13/2022: ALT 18; BUN 16; Creatinine, Ser 0.69; Hemoglobin 14.5; Platelets 221; Potassium 4.6; Sodium 142; TSH 0.778   Recent Lipid Panel    Component Value Date/Time   CHOL 127 08/13/2022 1004   TRIG 106 08/13/2022 1004   HDL 48 08/13/2022 1004   CHOLHDL 2.6 08/13/2022 1004   LDLCALC 59 08/13/2022 1004    Physical Exam:   VS:  BP 134/82   Pulse 88   Ht 5\' 2"  (1.575 m)   Wt 203 lb 3.2 oz (92.2 kg)   SpO2 96%   BMI 37.17 kg/m    Wt Readings from Last 3 Encounters:  09/25/22 203 lb 3.2 oz (92.2 kg)  09/13/22 196 lb (88.9 kg)  08/13/22 199 lb (90.3 kg)    General: Well nourished,  well developed, in no acute distress Head: Atraumatic, normal size  Eyes: PEERLA, EOMI  Neck: Supple, no JVD Endocrine: No thryomegaly Cardiac: Normal S1, S2; RRR; no murmurs, rubs, or gallops Lungs: Clear to auscultation bilaterally, no wheezing, rhonchi or rales  Abd: Soft, nontender, no hepatomegaly  Ext: No edema, pulses 2+ Musculoskeletal: No deformities, BUE and BLE strength normal and equal Skin: Warm and dry, no rashes   Neuro: Alert and oriented to person, place, time, and situation, CNII-XII grossly intact, no  focal deficits  Psych: Normal mood and affect   ASSESSMENT:   Shiley Linz is a 67 y.o. female who presents for the following: 1. RBBB    PLAN:   1. RBBB -EKG with right bundle branch block.  No symptoms.  We will set her up for an echocardiogram.  As long as this is normal she does not need any further testing.  She reports no major symptoms.  Overall doing quite well.  She will see me back as needed.  Regarding routine cardiovascular preventive measures her lipids are well-controlled despite a family history of heart disease.  Will continue current medications at current dose.  No changes.  Disposition: Return if symptoms worsen or fail to improve.  Medication Adjustments/Labs and Tests Ordered: Current medicines are reviewed at length with the patient today.  Concerns regarding medicines are outlined above.  Orders Placed This Encounter  Procedures   EKG 12-Lead   ECHOCARDIOGRAM COMPLETE   No orders of the defined types were placed in this encounter.   Patient Instructions  Medication Instructions:  The current medical regimen is effective;  continue present plan and medications.  *If you need a refill on your cardiac medications before your next appointment, please call your pharmacy*  Testing/Procedures: Echocardiogram - Your physician has requested that you have an echocardiogram. Echocardiography is a painless test that uses sound waves to create images  of your heart. It provides your doctor with information about the size and shape of your heart and how well your heart's chambers and valves are working. This procedure takes approximately one hour. There are no restrictions for this procedure.     Follow-Up: At [facility], you and your health needs are our priority.  As part of our continuing mission to provide you with exceptional heart care, we have created designated Provider Care Teams.  These Care Teams include your primary Cardiologist (physician) and Advanced Practice Providers (APPs -  Physician Assistants and Nurse Practitioners) who all work together to provide you with the care you need, when you need it.  We recommend signing up for the patient portal called "MyChart".  Sign up information is provided on this After Visit Summary.  MyChart is used to connect with patients for Virtual Visits (Telemedicine).  Patients are able to view lab/test results, encounter notes, upcoming appointments, etc.  Non-urgent messages can be sent to your provider as well.   To learn more about what you can do with MyChart, go to [email or web address].    Your next appointment:   As needed  Provider:   Lennie Odor, MD      Signed, Lenna Gilford. Flora Lipps, MD, [facility]  [facility]  [address] [place], [place] [address] [phone]  09/25/2022 4:15 PM

## 2022-09-25 ENCOUNTER — Ambulatory Visit: Payer: PPO | Attending: Cardiovascular Disease | Admitting: Cardiovascular Disease

## 2022-09-25 ENCOUNTER — Encounter: Payer: Self-pay | Admitting: Cardiovascular Disease

## 2022-09-25 VITALS — BP 134/82 | HR 88 | Ht 62.0 in | Wt 203.2 lb

## 2022-09-25 DIAGNOSIS — I4510 Unspecified right bundle-branch block: Secondary | ICD-10-CM | POA: Diagnosis not present

## 2022-09-25 NOTE — Patient Instructions (Signed)
Medication Instructions:  The current medical regimen is effective;  continue present plan and medications.  *If you need a refill on your cardiac medications before your next appointment, please call your pharmacy*   Testing/Procedures:  Echocardiogram - Your physician has requested that you have an echocardiogram. Echocardiography is a painless test that uses sound waves to create images of your heart. It provides your doctor with information about the size and shape of your heart and how well your heart's chambers and valves are working. This procedure takes approximately one hour. There are no restrictions for this procedure.     Follow-Up: At [facility], you and your health needs are our priority.  As part of our continuing mission to provide you with exceptional heart care, we have created designated Provider Care Teams.  These Care Teams include your primary Cardiologist (physician) and Advanced Practice Providers (APPs -  Physician Assistants and Nurse Practitioners) who all work together to provide you with the care you need, when you need it.  We recommend signing up for the patient portal called "MyChart".  Sign up information is provided on this After Visit Summary.  MyChart is used to connect with patients for Virtual Visits (Telemedicine).  Patients are able to view lab/test results, encounter notes, upcoming appointments, etc.  Non-urgent messages can be sent to your provider as well.   To learn more about what you can do with MyChart, go to [email or web address].    Your next appointment:   As needed  Provider:   Leando O'Neal, MD   

## 2022-10-06 ENCOUNTER — Other Ambulatory Visit: Payer: Self-pay | Admitting: Family

## 2022-10-06 DIAGNOSIS — E7800 Pure hypercholesterolemia, unspecified: Secondary | ICD-10-CM

## 2022-10-24 ENCOUNTER — Ambulatory Visit (HOSPITAL_COMMUNITY): Payer: PPO | Attending: Cardiology

## 2022-10-24 DIAGNOSIS — I4510 Unspecified right bundle-branch block: Secondary | ICD-10-CM | POA: Diagnosis not present

## 2022-10-24 LAB — ECHOCARDIOGRAM COMPLETE
Area-P 1/2: 3.3 cm2
S' Lateral: 2.4 cm

## 2022-11-05 DIAGNOSIS — M9903 Segmental and somatic dysfunction of lumbar region: Secondary | ICD-10-CM | POA: Diagnosis not present

## 2022-11-05 DIAGNOSIS — M5432 Sciatica, left side: Secondary | ICD-10-CM | POA: Diagnosis not present

## 2022-11-11 ENCOUNTER — Encounter: Payer: Self-pay | Admitting: Nurse Practitioner

## 2022-11-11 ENCOUNTER — Ambulatory Visit (INDEPENDENT_AMBULATORY_CARE_PROVIDER_SITE_OTHER): Payer: PPO | Admitting: Nurse Practitioner

## 2022-11-11 VITALS — BP 135/80 | HR 78 | Temp 97.5°F | Ht 62.0 in | Wt 202.6 lb

## 2022-11-11 DIAGNOSIS — R399 Unspecified symptoms and signs involving the genitourinary system: Secondary | ICD-10-CM | POA: Insufficient documentation

## 2022-11-11 DIAGNOSIS — N3001 Acute cystitis with hematuria: Secondary | ICD-10-CM | POA: Insufficient documentation

## 2022-11-11 LAB — MICROSCOPIC EXAMINATION: Renal Epithel, UA: NONE SEEN /hpf

## 2022-11-11 LAB — URINALYSIS, ROUTINE W REFLEX MICROSCOPIC
Bilirubin, UA: NEGATIVE
Glucose, UA: NEGATIVE
Ketones, UA: NEGATIVE
Nitrite, UA: NEGATIVE
Specific Gravity, UA: <1.005 — ABNORMAL LOW (ref 1.005–1.030)
Urobilinogen, Ur: 0.2 mg/dL (ref 0.2–1.0)
pH, UA: 6 (ref 5.0–7.5)

## 2022-11-11 MED ORDER — SULFAMETHOXAZOLE-TRIMETHOPRIM 400-80 MG PO TABS
1.0000 | ORAL_TABLET | Freq: Two times a day (BID) | ORAL | 0 refills | Status: DC
Start: 2022-11-11 — End: 2022-11-19

## 2022-11-11 NOTE — Patient Instructions (Signed)
Urinary Tract Infection, Adult A urinary tract infection (UTI) is an infection of any part of the urinary tract. The urinary tract includes: The kidneys. The ureters. The bladder. The urethra. These organs make, store, and get rid of pee (urine) in the body. What are the causes? This infection is caused by germs (bacteria) in your genital area. These germs grow and cause swelling (inflammation) of your urinary tract. What increases the risk? The following factors may make you more likely to develop this condition: Using a small, thin tube (catheter) to drain pee. Not being able to control when you pee or poop (incontinence). Being female. If you are female, these things can increase the risk: Using these methods to prevent pregnancy: A medicine that kills sperm (spermicide). A device that blocks sperm (diaphragm). Having low levels of a female hormone (estrogen). Being pregnant. You are more likely to develop this condition if: You have genes that add to your risk. You are sexually active. You take antibiotic medicines. You have trouble peeing because of: A prostate that is bigger than normal, if you are female. A blockage in the part of your body that drains pee from the bladder. A kidney stone. A nerve condition that affects your bladder. Not getting enough to drink. Not peeing often enough. You have other conditions, such as: Diabetes. A weak disease-fighting system (immune system). Sickle cell disease. Gout. Injury of the spine. What are the signs or symptoms? Symptoms of this condition include: Needing to pee right away. Peeing small amounts often. Pain or burning when peeing. Blood in the pee. Pee that smells bad or not like normal. Trouble peeing. Pee that is cloudy. Fluid coming from the vagina, if you are female. Pain in the belly or lower back. Other symptoms include: Vomiting. Not feeling hungry. Feeling mixed up (confused). This may be the first symptom in  older adults. Being tired and grouchy (irritable). A fever. Watery poop (diarrhea). How is this treated? Taking antibiotic medicine. Taking other medicines. Drinking enough water. In some cases, you may need to see a specialist. Follow these instructions at home:  Medicines Take over-the-counter and prescription medicines only as told by your doctor. If you were prescribed an antibiotic medicine, take it as told by your doctor. Do not stop taking it even if you start to feel better. General instructions Make sure you: Pee until your bladder is empty. Do not hold pee for a long time. Empty your bladder after sex. Wipe from front to back after peeing or pooping if you are a female. Use each tissue one time when you wipe. Drink enough fluid to keep your pee pale yellow. Keep all follow-up visits. Contact a doctor if: You do not get better after 1-2 days. Your symptoms go away and then come back. Get help right away if: You have very bad back pain. You have very bad pain in your lower belly. You have a fever. You have chills. You feeling like you will vomit or you vomit. Summary A urinary tract infection (UTI) is an infection of any part of the urinary tract. This condition is caused by germs in your genital area. There are many risk factors for a UTI. Treatment includes antibiotic medicines. Drink enough fluid to keep your pee pale yellow. This information is not intended to replace advice given to you by your health care provider. Make sure you discuss any questions you have with your health care provider. Document Revised: 11/28/2019 Document Reviewed: 12/03/2019 Elsevier Patient Education    2024 Elsevier Inc.  

## 2022-11-11 NOTE — Progress Notes (Signed)
Acute Office Visit  Subjective:     Patient ID: Tammy Lawson, female    DOB: 1956-01-07, 67 y.o.   MRN: [id number]  Chief Complaint  Patient presents with   Urinary Tract Infection    Urgency to urinate, pain when peeing, urine odor, lower back pain, started Friday.     HPI SUBJECTIVE: Tammy Lawson is a 67 y.o. female who complains of urinary frequency, urgency and dysuria x 4 days, with flank pain. Denies fever, chills, or abnormal vaginal discharge or bleeding.    Review of Systems  Constitutional:  Negative for chills and fever.  HENT: Negative.    Eyes: Negative.  Negative for pain.  Respiratory: Negative.  Negative for cough and shortness of breath.   Cardiovascular: Negative.  Negative for chest pain and leg swelling.  Genitourinary:  Positive for dysuria, flank pain, frequency and urgency.  Musculoskeletal:  Positive for back pain.  Skin:  Negative for itching and rash.  Neurological: Negative.  Negative for dizziness and weakness.  Endo/Heme/Allergies:  Negative for polydipsia. Does not bruise/bleed easily.   Negative unless indicated in HPI    Objective:    BP 135/80   Pulse 78   Temp (!) 97.5 F (36.4 C) (Temporal)   Ht 5\' 2"  (1.575 m)   Wt 202 lb 9.6 oz (91.9 kg)   SpO2 99%   BMI 37.06 kg/m  BP Readings from Last 3 Encounters:  11/11/22 135/80  09/25/22 134/82  08/13/22 132/77   Wt Readings from Last 3 Encounters:  11/11/22 202 lb 9.6 oz (91.9 kg)  09/25/22 203 lb 3.2 oz (92.2 kg)  09/13/22 196 lb (88.9 kg)      Physical Exam Vitals and nursing note reviewed.  Constitutional:      Appearance: Normal appearance.  HENT:     Head: Normocephalic and atraumatic.  Eyes:     General: No scleral icterus.    Extraocular Movements: Extraocular movements intact.     Conjunctiva/sclera: Conjunctivae normal.     Pupils: Pupils are equal, round, and reactive to light.  Cardiovascular:     Rate and Rhythm: Regular rhythm.     Heart sounds: Normal heart  sounds.  Pulmonary:     Effort: Pulmonary effort is normal.     Breath sounds: Normal breath sounds.  Abdominal:     General: Bowel sounds are normal.     Palpations: Abdomen is soft.     Tenderness: There is no abdominal tenderness. There is no right CVA tenderness or left CVA tenderness.     Hernia: No hernia is present.  Musculoskeletal:        General: Normal range of motion.     Right lower leg: No edema.     Left lower leg: No edema.  Skin:    General: Skin is warm and dry.     Findings: No rash.  Neurological:     General: No focal deficit present.     Mental Status: She is alert and oriented to person, place, and time. Mental status is at baseline.  Psychiatric:        Mood and Affect: Mood normal.        Behavior: Behavior normal.        Thought Content: Thought content normal.        Judgment: Judgment normal.    Urine dipstick shows positive for 3 +RBC's, positive for +1 protein, and positive for +3 leukocytes.  + yeast.  No results found for any visits  on 11/11/22.      Assessment & Plan:  UTI symptoms -     Urinalysis, Routine w reflex microscopic -     Sulfamethoxazole-Trimethoprim; Take 1 tablet by mouth 2 (two) times daily.  Dispense: 14 tablet; Refill: 0  Acute cystitis with hematuria -     Sulfamethoxazole-Trimethoprim; Take 1 tablet by mouth 2 (two) times daily.  Dispense: 14 tablet; Refill: 0   ASSESSMENT: UTI uncomplicated without evidence of pyelonephritis  PLAN: Acute cystitis - Increase hydration  may use Pyridium OTC prn.  Call or return to clinic prn if these symptoms worsen or fail to improve as anticipated.  Return if symptoms worsen or fail to improve.  Arrie Aran Santa Lighter, DNP [facility] [address] [place], [place] [address] [phone]

## 2022-11-12 ENCOUNTER — Ambulatory Visit: Payer: PPO | Admitting: Nurse Practitioner

## 2022-11-12 NOTE — Progress Notes (Signed)
Result discussed with client during the encounter

## 2022-11-16 ENCOUNTER — Encounter (HOSPITAL_BASED_OUTPATIENT_CLINIC_OR_DEPARTMENT_OTHER): Payer: Self-pay

## 2022-11-16 ENCOUNTER — Emergency Department (HOSPITAL_BASED_OUTPATIENT_CLINIC_OR_DEPARTMENT_OTHER): Payer: PPO

## 2022-11-16 ENCOUNTER — Other Ambulatory Visit: Payer: Self-pay

## 2022-11-16 ENCOUNTER — Inpatient Hospital Stay (HOSPITAL_BASED_OUTPATIENT_CLINIC_OR_DEPARTMENT_OTHER)
Admission: EM | Admit: 2022-11-16 | Discharge: 2022-11-19 | DRG: 690 | Disposition: A | Discharge status: Home / Self Care | Payer: PPO | Attending: Internal Medicine | Admitting: Internal Medicine

## 2022-11-16 DIAGNOSIS — Z823 Family history of stroke: Secondary | ICD-10-CM

## 2022-11-16 DIAGNOSIS — R109 Unspecified abdominal pain: Secondary | ICD-10-CM | POA: Diagnosis not present

## 2022-11-16 DIAGNOSIS — R6510 Systemic inflammatory response syndrome (SIRS) of non-infectious origin without acute organ dysfunction: Secondary | ICD-10-CM | POA: Diagnosis not present

## 2022-11-16 DIAGNOSIS — M8580 Other specified disorders of bone density and structure, unspecified site: Secondary | ICD-10-CM | POA: Diagnosis present

## 2022-11-16 DIAGNOSIS — R110 Nausea: Secondary | ICD-10-CM | POA: Diagnosis present

## 2022-11-16 DIAGNOSIS — Z825 Family history of asthma and other chronic lower respiratory diseases: Secondary | ICD-10-CM

## 2022-11-16 DIAGNOSIS — Z881 Allergy status to other antibiotic agents status: Secondary | ICD-10-CM

## 2022-11-16 DIAGNOSIS — Z83438 Family history of other disorder of lipoprotein metabolism and other lipidemia: Secondary | ICD-10-CM

## 2022-11-16 DIAGNOSIS — Z800 Family history of malignant neoplasm of digestive organs: Secondary | ICD-10-CM

## 2022-11-16 DIAGNOSIS — L270 Generalized skin eruption due to drugs and medicaments taken internally: Secondary | ICD-10-CM | POA: Diagnosis present

## 2022-11-16 DIAGNOSIS — E559 Vitamin D deficiency, unspecified: Secondary | ICD-10-CM | POA: Diagnosis present

## 2022-11-16 DIAGNOSIS — Z6836 Body mass index (BMI) 36.0-36.9, adult: Secondary | ICD-10-CM

## 2022-11-16 DIAGNOSIS — E7800 Pure hypercholesterolemia, unspecified: Secondary | ICD-10-CM | POA: Diagnosis present

## 2022-11-16 DIAGNOSIS — R739 Hyperglycemia, unspecified: Secondary | ICD-10-CM | POA: Diagnosis present

## 2022-11-16 DIAGNOSIS — Z833 Family history of diabetes mellitus: Secondary | ICD-10-CM

## 2022-11-16 DIAGNOSIS — Z880 Allergy status to penicillin: Secondary | ICD-10-CM

## 2022-11-16 DIAGNOSIS — N179 Acute kidney failure, unspecified: Secondary | ICD-10-CM | POA: Diagnosis present

## 2022-11-16 DIAGNOSIS — R945 Abnormal results of liver function studies: Secondary | ICD-10-CM | POA: Diagnosis present

## 2022-11-16 DIAGNOSIS — Z83719 Family history of colon polyps, unspecified: Secondary | ICD-10-CM

## 2022-11-16 DIAGNOSIS — I10 Essential (primary) hypertension: Secondary | ICD-10-CM | POA: Diagnosis present

## 2022-11-16 DIAGNOSIS — Z8042 Family history of malignant neoplasm of prostate: Secondary | ICD-10-CM

## 2022-11-16 DIAGNOSIS — Z8249 Family history of ischemic heart disease and other diseases of the circulatory system: Secondary | ICD-10-CM

## 2022-11-16 DIAGNOSIS — K439 Ventral hernia without obstruction or gangrene: Secondary | ICD-10-CM | POA: Diagnosis not present

## 2022-11-16 DIAGNOSIS — Z79899 Other long term (current) drug therapy: Secondary | ICD-10-CM

## 2022-11-16 DIAGNOSIS — N3001 Acute cystitis with hematuria: Principal | ICD-10-CM | POA: Diagnosis present

## 2022-11-16 DIAGNOSIS — R509 Fever, unspecified: Secondary | ICD-10-CM | POA: Diagnosis not present

## 2022-11-16 DIAGNOSIS — A419 Sepsis, unspecified organism: Secondary | ICD-10-CM | POA: Diagnosis present

## 2022-11-16 DIAGNOSIS — T368X5A Adverse effect of other systemic antibiotics, initial encounter: Secondary | ICD-10-CM | POA: Diagnosis present

## 2022-11-16 LAB — URINALYSIS, MICROSCOPIC (REFLEX)

## 2022-11-16 LAB — COMPREHENSIVE METABOLIC PANEL
ALT: 56 U/L — ABNORMAL HIGH (ref 0–44)
AST: 49 U/L — ABNORMAL HIGH (ref 15–41)
Albumin: 4 g/dL (ref 3.5–5.0)
Alkaline Phosphatase: 85 U/L (ref 38–126)
Anion gap: 11 (ref 5–15)
BUN: 16 mg/dL (ref 8–23)
CO2: 24 mmol/L (ref 22–32)
Calcium: 8.8 mg/dL — ABNORMAL LOW (ref 8.9–10.3)
Chloride: 99 mmol/L (ref 98–111)
Creatinine, Ser: 1.12 mg/dL — ABNORMAL HIGH (ref 0.44–1.00)
GFR, Estimated: 54 mL/min — ABNORMAL LOW (ref >60)
Glucose, Bld: 138 mg/dL — ABNORMAL HIGH (ref 70–99)
Potassium: 3.9 mmol/L (ref 3.5–5.1)
Sodium: 134 mmol/L — ABNORMAL LOW (ref 135–145)
Total Bilirubin: 0.5 mg/dL (ref 0.3–1.2)
Total Protein: 7.6 g/dL (ref 6.5–8.1)

## 2022-11-16 LAB — CBC
HCT: 41.5 % (ref 36.0–46.0)
Hemoglobin: 13.7 g/dL (ref 12.0–15.0)
MCH: 29.8 pg (ref 26.0–34.0)
MCHC: 33 g/dL (ref 30.0–36.0)
MCV: 90.2 fL (ref 80.0–100.0)
Platelets: 191 10*3/uL (ref 150–400)
RBC: 4.6 MIL/uL (ref 3.87–5.11)
RDW: 13.4 % (ref 11.5–15.5)
WBC: 8.8 10*3/uL (ref 4.0–10.5)
nRBC: 0 % (ref 0.0–0.2)

## 2022-11-16 LAB — URINALYSIS, ROUTINE W REFLEX MICROSCOPIC
Bilirubin Urine: NEGATIVE
Glucose, UA: NEGATIVE mg/dL
Ketones, ur: NEGATIVE mg/dL
Leukocytes,Ua: NEGATIVE
Nitrite: NEGATIVE
Protein, ur: 100 mg/dL — AB
Specific Gravity, Urine: 1.025 (ref 1.005–1.030)
pH: 5.5 (ref 5.0–8.0)

## 2022-11-16 LAB — LACTIC ACID, PLASMA: Lactic Acid, Venous: 1.1 mmol/L (ref 0.5–1.9)

## 2022-11-16 MED ORDER — SODIUM CHLORIDE 0.9 % IV BOLUS
500.0000 mL | Freq: Once | INTRAVENOUS | Status: AC
  Administered 2022-11-16: 500 mL via INTRAVENOUS

## 2022-11-16 MED ORDER — ACETAMINOPHEN 500 MG PO TABS
1000.0000 mg | ORAL_TABLET | Freq: Once | ORAL | Status: AC
  Administered 2022-11-16: 1000 mg via ORAL
  Filled 2022-11-16: qty 2

## 2022-11-16 MED ORDER — IOHEXOL 300 MG/ML  SOLN
100.0000 mL | Freq: Once | INTRAMUSCULAR | Status: AC | PRN
  Administered 2022-11-16: 100 mL via INTRAVENOUS

## 2022-11-16 MED ORDER — SODIUM CHLORIDE 0.9 % IV BOLUS
1000.0000 mL | Freq: Once | INTRAVENOUS | Status: AC
  Administered 2022-11-16: 1000 mL via INTRAVENOUS

## 2022-11-16 MED ORDER — ACETAMINOPHEN 500 MG PO TABS
ORAL_TABLET | ORAL | Status: AC
  Filled 2022-11-16: qty 1

## 2022-11-16 MED ORDER — SODIUM CHLORIDE 0.9 % IV SOLN: Freq: Once | INTRAVENOUS | Status: AC

## 2022-11-16 MED ORDER — SODIUM CHLORIDE 0.9 % IV SOLN
1.0000 g | Freq: Once | INTRAVENOUS | Status: AC
  Administered 2022-11-16: 1 g via INTRAVENOUS
  Filled 2022-11-16: qty 10

## 2022-11-16 NOTE — ED Notes (Signed)
[facility] called for transport. 

## 2022-11-16 NOTE — ED Notes (Signed)
ED TO INPATIENT HANDOFF REPORT  ED Nurse Name and Phone #: Vena Austria, RN  S Name/Age/Gender Tammy Lawson 67 y.o. female Room/Bed: MH09/MH09  Code Status   Code Status: Not on file  Home/SNF/Other Home Patient oriented to: self, place, time, and situation Is this baseline? Yes   Triage Complete: Triage complete  Chief Complaint SIRS (systemic inflammatory response syndrome) (HCC) [R65.10]  Triage Note Patient was dx with a UTI on Monday and given antibiotics for same. She is still having fever at home, flank pain and nausea. She stated her fever was 103 last night. She took Tylenol at 7:30 this morning.    Allergies Allergies  Allergen Reactions   Other     Ranch dressing  - GI    Augmentin [Amoxicillin-Pot Clavulanate] Other (See Comments)    Causes fever on 2 nd week ( this has happened x 2 occ)     Level of Care/Admitting Diagnosis ED Disposition     ED Disposition  Admit   Condition  --   Comment  Hospital Area: [facility] [[id number]]  Level of Care: Telemetry [5]  Admit to tele based on following criteria: Monitor for Ischemic changes  Interfacility transfer: Yes  May place patient in observation at [facility] or [facility] if equivalent level of care is available:: Yes  Covid Evaluation: Asymptomatic - no recent exposure (last 10 days) testing not required  Diagnosis: SIRS (systemic inflammatory response syndrome) [facility]) [[id number]]  Admitting Physician: Angie Fava [[id number]]  Attending Physician: Angie Fava [[id number]]          B Medical/Surgery History Past Medical History:  Diagnosis Date   Cataract    HLD (hyperlipidemia)    HTN (hypertension)    Osteopenia    Vitamin D deficiency    Past Surgical History:  Procedure Laterality Date   NO PAST SURGERIES       A IV Location/Drains/Wounds Patient Lines/Drains/Airways Status     Active Line/Drains/Airways     Name Placement date Placement time Site Days    Peripheral IV 11/16/22 20 G Posterior;Right Forearm 11/16/22  1655  Forearm  less than 1            Intake/Output Last 24 hours  Intake/Output Summary (Last 24 hours) at 11/16/2022 2103 Last data filed at 11/16/2022 1953 Gross per 24 hour  Intake 600.15 ml  Output --  Net 600.15 ml    Labs/Imaging Results for orders placed or performed during the hospital encounter of 11/16/22 (from the past 48 hour(s))  CBC     Status: None   Collection Time: 11/16/22  2:50 PM  Result Value Ref Range   WBC 8.8 4.0 - 10.5 K/uL   RBC 4.60 3.87 - 5.11 MIL/uL   Hemoglobin 13.7 12.0 - 15.0 g/dL   HCT 29.5 62.1 - 30.8 %   MCV 90.2 80.0 - 100.0 fL   MCH 29.8 26.0 - 34.0 pg   MCHC 33.0 30.0 - 36.0 g/dL   RDW 65.7 84.6 - 96.2 %   Platelets 191 150 - 400 K/uL   nRBC 0.0 0.0 - 0.2 %    Comment: Performed at [facility], [address] [place], [place] [address]  Comprehensive metabolic panel     Status: Abnormal   Collection Time: 11/16/22  2:50 PM  Result Value Ref Range   Sodium 134 (L) 135 - 145 mmol/L   Potassium 3.9 3.5 - 5.1 mmol/L   Chloride 99 98 -  111 mmol/L   CO2 24 22 - 32 mmol/L   Glucose, Bld 138 (H) 70 - 99 mg/dL    Comment: Glucose reference range applies only to samples taken after fasting for at least 8 hours.   BUN 16 8 - 23 mg/dL   Creatinine, Ser 9.14 (H) 0.44 - 1.00 mg/dL   Calcium 8.8 (L) 8.9 - 10.3 mg/dL   Total Protein 7.6 6.5 - 8.1 g/dL   Albumin 4.0 3.5 - 5.0 g/dL   AST 49 (H) 15 - 41 U/L   ALT 56 (H) 0 - 44 U/L   Alkaline Phosphatase 85 38 - 126 U/L   Total Bilirubin 0.5 0.3 - 1.2 mg/dL   GFR, Estimated 54 (L) >60 mL/min    Comment: (NOTE) Calculated using the CKD-EPI Creatinine Equation (2021)    Anion gap 11 5 - 15    Comment: Performed at [facility], [address] [place], [place] [address]  Urinalysis, Routine w reflex microscopic -Urine, Clean Catch     Status: Abnormal   Collection Time: 11/16/22  2:50 PM  Result Value Ref  Range   Color, Urine YELLOW YELLOW   APPearance CLOUDY (A) CLEAR   Specific Gravity, Urine 1.025 1.005 - 1.030   pH 5.5 5.0 - 8.0   Glucose, UA NEGATIVE NEGATIVE mg/dL   Hgb urine dipstick MODERATE (A) NEGATIVE   Bilirubin Urine NEGATIVE NEGATIVE   Ketones, ur NEGATIVE NEGATIVE mg/dL   Protein, ur 621 (A) NEGATIVE mg/dL   Nitrite NEGATIVE NEGATIVE   Leukocytes,Ua NEGATIVE NEGATIVE    Comment: Performed at [facility], [address] [place], [place] [address]  Lactic acid, plasma     Status: None   Collection Time: 11/16/22  2:50 PM  Result Value Ref Range   Lactic Acid, Venous 1.1 0.5 - 1.9 mmol/L    Comment: Performed at [facility], [address] [place], [place] [address]  Urinalysis, Microscopic (reflex)     Status: Abnormal   Collection Time: 11/16/22  2:50 PM  Result Value Ref Range   RBC / HPF 6-10 0 - 5 RBC/hpf   WBC, UA 6-10 0 - 5 WBC/hpf   Bacteria, UA FEW (A) NONE SEEN   Squamous Epithelial / HPF 6-10 0 - 5 /HPF   Hyaline Casts, UA PRESENT     Comment: Performed at [facility], [address] [place], [place] [address]   CT ABDOMEN PELVIS W CONTRAST  Result Date: 11/16/2022 CLINICAL DATA:  Fever and flank pain. Patient is being treated for UTI. EXAM: CT ABDOMEN AND PELVIS WITH CONTRAST TECHNIQUE: Multidetector CT imaging of the abdomen and pelvis was performed using the standard protocol following bolus administration of intravenous contrast. RADIATION DOSE REDUCTION: This exam was performed according to the departmental dose-optimization program which includes automated exposure control, adjustment of the mA and/or kV according to patient size and/or use of iterative reconstruction technique. CONTRAST:  OMNIPAQUE IOHEXOL 300 MG/ML  SOLN COMPARISON:  None Available. FINDINGS: Lower chest: No acute abnormality. Hepatobiliary: No focal liver abnormality is seen. No gallstones, gallbladder wall thickening, or biliary dilatation.  Pancreas: Unremarkable. No pancreatic ductal dilatation or surrounding inflammatory changes. Spleen: Normal in size without focal abnormality. Adrenals/Urinary Tract: Adrenal glands are unremarkable. Kidneys are normal, without renal calculi, focal lesion, or hydronephrosis. Bladder is unremarkable. Stomach/Bowel: Stomach is within normal limits. No evidence of appendicitis. No evidence of bowel wall thickening, distention, or inflammatory  changes. Vascular/Lymphatic: Aortic atherosclerosis. Shotty retroperitoneal lymph nodes. Reproductive: Uterus and bilateral adnexa are unremarkable. Other: Fat containing periumbilical anterior abdominal wall hernia. Musculoskeletal: Spondylosis of the lumbosacral spine. IMPRESSION: 1. No acute abnormality identified within the abdomen or pelvis. 2. Fat containing periumbilical anterior abdominal wall hernia. 3. Aortic atherosclerosis. Aortic Atherosclerosis (ICD10-I70.0). Electronically Signed   By: Ted Mcalpine M.D.   On: 11/16/2022 19:01    Pending Labs Unresulted Labs (From admission, onward)     Start     Ordered   11/16/22 1619  Urine Culture  Once,   URGENT       Question:  Indication  Answer:  Flank Pain   11/16/22 1620   11/16/22 1619  Culture, blood (routine x 2)  BLOOD CULTURE X 2,   STAT      11/16/22 1620            Vitals/Pain Today's Vitals   11/16/22 1940 11/16/22 2000 11/16/22 2030 11/16/22 2045  BP:  (!) 119/51 (!) 100/49 (!) 96/52  Pulse:  (!) 101 (!) 103 (!) 102  Resp:    18  Temp: (!) 100.6 F (38.1 C)   99.2 F (37.3 C)  TempSrc: Oral   Oral  SpO2:  95% 95% 95%  Weight:      Height:      PainSc:        Isolation Precautions No active isolations  Medications Medications  0.9 %  sodium chloride infusion (has no administration in time range)  sodium chloride 0.9 % bolus 500 mL (0 mLs Intravenous Stopped 11/16/22 1827)  iohexol (OMNIPAQUE) 300 MG/ML solution 100 mL (100 mLs Intravenous Contrast Given 11/16/22 1822)   acetaminophen (TYLENOL) tablet 1,000 mg (1,000 mg Oral Not Given 11/16/22 1830)  cefTRIAXone (ROCEPHIN) 1 g in sodium chloride 0.9 % 100 mL IVPB (0 g Intravenous Stopped 11/16/22 1953)  sodium chloride 0.9 % bolus 1,000 mL (1,000 mLs Intravenous Started During Downtime 11/16/22 2058)    Mobility walks     Focused Assessments Cardiac Assessment Handoff:    No results found for: "CKTOTAL", "CKMB", "CKMBINDEX", "TROPONINI" No results found for: "DDIMER" Does the Patient currently have chest pain? Yes    R Recommendations: See Admitting Provider Note  Report given to:   Additional Notes: Pt A&O x 4, walks independently

## 2022-11-16 NOTE — ED Notes (Signed)
BP rechecked to verify soft pressures. EDP notified of current BP. Orders received for IV fluids.

## 2022-11-16 NOTE — ED Provider Notes (Signed)
Emergency Department Provider Note   I have reviewed the triage vital signs and the nursing notes.   HISTORY  Chief Complaint Flank Pain   HPI Tammy Lawson is a 67 y.o. female with PMH of HLD and HTN presents emergency department with shaking chills and development of right flank pain.  She had a max temp yesterday of 103 F.  She was diagnosed with a UTI by her PCP in [place] on 7/8.  She has been compliant with her Bactrim.  She was having some mild dysuria at the time.  Her symptoms improved for 2 days and then seemed to suddenly worsen.  She is slowly developed right flank pain without radiation.  No URI symptoms.  No chest pain or shortness of breath.  No severe headaches, sore throat, or other acute symptoms.    Past Medical History:  Diagnosis Date   Cataract    HLD (hyperlipidemia)    HTN (hypertension)    Osteopenia    Vitamin D deficiency     Review of Systems  Constitutional: Positive fever/chills Cardiovascular: Denies chest pain. Respiratory: Denies shortness of breath. Gastrointestinal: Positive right flank pain.  No nausea, no vomiting.  No diarrhea.  No constipation. Genitourinary: Negative for dysuria. Musculoskeletal: Negative for back pain. Skin: Negative for rash. Neurological: Negative for headaches, focal weakness or numbness.   ____________________________________________   PHYSICAL EXAM:  VITAL SIGNS: ED Triage Vitals  Encounter Vitals Group     BP 11/16/22 1425 (!) 149/56     Pulse Rate 11/16/22 1425 (!) 102     Resp 11/16/22 1425 18     Temp 11/16/22 1425 99.5 F (37.5 C)     Temp Source 11/16/22 1425 Oral     SpO2 11/16/22 1425 99 %     Weight 11/16/22 1425 200 lb 9.9 oz (91 kg)     Height 11/16/22 1425 5\' 2"  (1.575 m)   Constitutional: Alert and oriented. Well appearing and in no acute distress. Eyes: Conjunctivae are normal.  Head: Atraumatic. Nose: No congestion/rhinnorhea. Mouth/Throat: Mucous membranes are  moist.  Oropharynx non-erythematous. Neck: No stridor.  Cardiovascular: Normal rate, regular rhythm. Good peripheral circulation. Grossly normal heart sounds.   Respiratory: Normal respiratory effort.  No retractions. Lungs CTAB. Gastrointestinal: Soft and nontender. Negative right CVA tenderness. No distention.  Musculoskeletal: No lower extremity tenderness nor edema. No gross deformities of extremities. Neurologic:  Normal speech and language. No gross focal neurologic deficits are appreciated.  Skin:  Skin is warm, dry and intact. No rash noted.   ____________________________________________   LABS (all labs ordered are listed, but only abnormal results are displayed)  Labs Reviewed  URINE CULTURE - Abnormal; Notable for the following components:      Result Value   Culture   (*)    Value: <10,000 COLONIES/mL INSIGNIFICANT GROWTH Performed at [facility], [address], [place], [place] [address]    All other components within normal limits  COMPREHENSIVE METABOLIC PANEL - Abnormal; Notable for the following components:   Sodium 134 (*)    Glucose, Bld 138 (*)    Creatinine, Ser 1.12 (*)    Calcium 8.8 (*)    AST 49 (*)    ALT 56 (*)    GFR, Estimated 54 (*)    All other components within normal limits  URINALYSIS, ROUTINE W REFLEX MICROSCOPIC - Abnormal; Notable for the following components:   APPearance CLOUDY (*)    Hgb urine dipstick MODERATE (*)  Protein, ur 100 (*)    All other components within normal limits  URINALYSIS, MICROSCOPIC (REFLEX) - Abnormal; Notable for the following components:   Bacteria, UA FEW (*)    All other components within normal limits  BASIC METABOLIC PANEL - Abnormal; Notable for the following components:   CO2 20 (*)    Glucose, Bld 136 (*)    Calcium 7.9 (*)    All other components within normal limits  CBC WITH DIFFERENTIAL/PLATELET - Abnormal; Notable for the following components:   Hemoglobin 11.8 (*)    Lymphs Abs 0.2  (*)    All other components within normal limits  HEMOGLOBIN A1C - Abnormal; Notable for the following components:   Hgb A1c MFr Bld 6.0 (*)    All other components within normal limits  GLUCOSE, CAPILLARY - Abnormal; Notable for the following components:   Glucose-Capillary 102 (*)    All other components within normal limits  CBC - Abnormal; Notable for the following components:   RBC 3.55 (*)    Hemoglobin 10.4 (*)    HCT 32.7 (*)    All other components within normal limits  COMPREHENSIVE METABOLIC PANEL - Abnormal; Notable for the following components:   CO2 20 (*)    Calcium 7.7 (*)    Total Protein 5.8 (*)    Albumin 2.6 (*)    AST 82 (*)    ALT 119 (*)    Alkaline Phosphatase 204 (*)    All other components within normal limits  CULTURE, BLOOD (ROUTINE X 2)  CULTURE, BLOOD (ROUTINE X 2)  CBC  LACTIC ACID, PLASMA  MAGNESIUM  HEPATITIS PANEL, ACUTE  DIFFERENTIAL    ____________________________________________   PROCEDURES  Procedure(s) performed:   Procedures  None ____________________________________________   INITIAL IMPRESSION / ASSESSMENT AND PLAN / ED COURSE  Pertinent labs & imaging results that were available during my care of the patient were reviewed by me and considered in my medical decision making (see chart for details).   This patient is Presenting for Evaluation of flank pain, which does require a range of treatment options, and is a complaint that involves a high risk of morbidity and mortality.  The Differential Diagnoses includes but is not exclusive to acute cholecystitis, intrathoracic causes for epigastric abdominal pain, gastritis, duodenitis, pancreatitis, small bowel or large bowel obstruction, abdominal aortic aneurysm, hernia, gastritis, etc.   Critical Interventions-    Medications  sodium chloride 0.9 % bolus 500 mL (0 mLs Intravenous Stopped 11/16/22 1827)  iohexol (OMNIPAQUE) 300 MG/ML solution 100 mL (100 mLs Intravenous  Contrast Given 11/16/22 1822)  acetaminophen (TYLENOL) tablet 1,000 mg (1,000 mg Oral Not Given 11/16/22 1830)  cefTRIAXone (ROCEPHIN) 1 g in sodium chloride 0.9 % 100 mL IVPB (0 g Intravenous Stopped 11/16/22 1953)  sodium chloride 0.9 % bolus 1,000 mL (0 mLs Intravenous Stopped 11/16/22 2353)  0.9 %  sodium chloride infusion ( Intravenous New Bag/Given 11/16/22 2353)  acetaminophen (TYLENOL) tablet 1,000 mg (1,000 mg Oral Given 11/16/22 2350)  ibuprofen (ADVIL) tablet 600 mg (600 mg Oral Given 11/17/22 0245)  acetaminophen (TYLENOL) tablet 650 mg (650 mg Oral Given 11/18/22 1050)  diphenhydrAMINE (BENADRYL) injection 12.5 mg (12.5 mg Intravenous Given 11/18/22 1159)    Reassessment after intervention: symptoms improved.    I did obtain Additional Historical Information from husband at bedside.  I decided to review pertinent External Data, and in summary [facility] PCP note from 7/8 reviewed. No urine culture.   Clinical Laboratory Tests Ordered,  included UA with moderate hemoglobin and protein.  Few bacteria noted.  Plan to send for culture.  No leukocytosis.  Lactic acid normal.  LFTs mildly elevated with normal bilirubin.   Radiologic Tests Ordered, included CT abdomen/pelvis. I independently interpreted the images and agree with radiology interpretation.   Cardiac Monitor Tracing which shows NSR.    Social Determinants of Health Risk patient is a non-smoker.   Consult complete with [facility]. Plan for admit.   Medical Decision Making: Summary:  Patient presents to the emergency department with right flank pain and shaking chills.  She is having some mild rigors on my initial assessment. Question of UTI recently with worsening symptoms while on Bactrim. Plan for CT abdomen/pelvis. Negative lactic. With rigors, will send culture.   Reevaluation with update and discussion with patient. Plan for obs admit. Presumed UA source with SIRS vitals.    Patient's presentation is most consistent  with acute presentation with potential threat to life or bodily function.   Disposition: admit  ____________________________________________  FINAL CLINICAL IMPRESSION(S) / ED DIAGNOSES  Final diagnoses:  SIRS (systemic inflammatory response syndrome) (HCC)     NEW OUTPATIENT MEDICATIONS STARTED DURING THIS VISIT:  Discharge Medication List as of 11/19/2022  6:02 PM     START taking these medications   Details  diphenhydrAMINE (BENADRYL) 50 MG tablet Take 0.5 tablets (25 mg total) by mouth every 8 (eight) hours as needed for itching or allergies., Starting Tue 11/19/2022, Normal    hydrOXYzine (ATARAX) 10 MG tablet Take 1 tablet (10 mg total) by mouth 3 (three) times daily., Starting Tue 11/19/2022, Normal    famotidine (PEPCID) 40 MG tablet Take 1 tablet (40 mg total) by mouth daily., Starting Wed 11/20/2022, Normal    predniSONE (DELTASONE) 20 MG tablet Take 1 tablet (20 mg total) by mouth daily with breakfast., Starting Wed 11/20/2022, Normal    senna-docusate (SENOKOT-S) 8.6-50 MG tablet Take 1 tablet by mouth at bedtime as needed for mild constipation., Starting Tue 11/19/2022, OTC        Note:  This document was prepared using Dragon voice recognition software and may include unintentional dictation errors.  Alona Bene, MD, [facility]    Champayne Kocian, Arlyss Repress, MD 11/20/22 1122

## 2022-11-16 NOTE — ED Notes (Signed)
Attempted to call report, was told they needed to assign the room and could not take report at this time, they will c/b

## 2022-11-16 NOTE — ED Notes (Signed)
Called lab, urine culture to be added to existing sample

## 2022-11-16 NOTE — Progress Notes (Signed)
Hospitalist Transfer Note:  Transferring facility: [facility] Requesting provider: Dr. Alona Bene (EDP at [facility]) Reason for transfer: admission for further evaluation and management of SIRS criteria and concern for potential UTI.      67  year old female who presented to [facility] ED complaining of subjective fever over the last 48 hours, reporting a temperature max at home over that timeframe of 103.  She notes that she was recently diagnosed with a urinary tract infection as an outpatient, for which she was prescribed Bactrim by her PCP earlier this week.  She has been compliant in taking her Bactrim and initially began to experience some improvement in her precipitating dysuria.  However, over the last 48 hours, she has noted worsening of her dysuria associated with the now objective fever over that timeframe, associated with full body rigors.  EDP conveys that a urine culture was not sent as an outpatient prior to initiation of Bactrim.  Not associate with any headache, altered mental status, or known exposure to ticks.  She also complained of some right-sided flank discomfort, with CT abdomen/pelvis showing no evidence of acute process today.  Vital signs in the ED were notable for the following: Temperature max of 102, heart rates in the low 100s to 120s, noted to be sinus tachycardia.  Systolic pressures have been in the 1- teens to 150s mmHg.  Labs were notable for what was of count 8800, lactic acid 1.1.  Blood cultures x 2 were collected.  Urinalysis notable for 6-10 white blood cells, and urine sample was sent for culture, following which she received dose of Rocephin for concern for failure of outpatient antibiotics to treat an underlying urinary tract infection.   Subsequently, I accepted this patient for transfer for observation  to a med-tele bed at [facility] or [facility] (first available) for further work-up and management of the above .       Nursing staff, Please call [facility] Admits & Consults System-Wide  number on Amion [phone]) as soon as patient's arrival, so appropriate admitting provider can evaluate the pt.     Newton Pigg, DO Hospitalist

## 2022-11-16 NOTE — ED Notes (Addendum)
Attempted to call report to [facility] listed as the receiving RN in [facility], states she is not getting this pt and to call the CN

## 2022-11-16 NOTE — ED Triage Notes (Addendum)
Patient was dx with a UTI on Monday and given antibiotics for same. She is still having fever at home, flank pain and nausea. She stated her fever was 103 last night. She took Tylenol at 7:30 this morning.

## 2022-11-17 DIAGNOSIS — R110 Nausea: Secondary | ICD-10-CM | POA: Diagnosis not present

## 2022-11-17 DIAGNOSIS — N179 Acute kidney failure, unspecified: Secondary | ICD-10-CM | POA: Diagnosis not present

## 2022-11-17 DIAGNOSIS — E559 Vitamin D deficiency, unspecified: Secondary | ICD-10-CM | POA: Diagnosis not present

## 2022-11-17 DIAGNOSIS — R6510 Systemic inflammatory response syndrome (SIRS) of non-infectious origin without acute organ dysfunction: Secondary | ICD-10-CM

## 2022-11-17 DIAGNOSIS — N3001 Acute cystitis with hematuria: Secondary | ICD-10-CM | POA: Diagnosis not present

## 2022-11-17 DIAGNOSIS — Z8249 Family history of ischemic heart disease and other diseases of the circulatory system: Secondary | ICD-10-CM | POA: Diagnosis not present

## 2022-11-17 DIAGNOSIS — R509 Fever, unspecified: Secondary | ICD-10-CM | POA: Diagnosis not present

## 2022-11-17 DIAGNOSIS — Z83438 Family history of other disorder of lipoprotein metabolism and other lipidemia: Secondary | ICD-10-CM | POA: Diagnosis not present

## 2022-11-17 DIAGNOSIS — Z825 Family history of asthma and other chronic lower respiratory diseases: Secondary | ICD-10-CM | POA: Diagnosis not present

## 2022-11-17 DIAGNOSIS — L270 Generalized skin eruption due to drugs and medicaments taken internally: Secondary | ICD-10-CM | POA: Diagnosis not present

## 2022-11-17 DIAGNOSIS — T368X5A Adverse effect of other systemic antibiotics, initial encounter: Secondary | ICD-10-CM | POA: Diagnosis not present

## 2022-11-17 DIAGNOSIS — R739 Hyperglycemia, unspecified: Secondary | ICD-10-CM | POA: Diagnosis not present

## 2022-11-17 DIAGNOSIS — Z823 Family history of stroke: Secondary | ICD-10-CM | POA: Diagnosis not present

## 2022-11-17 DIAGNOSIS — R21 Rash and other nonspecific skin eruption: Secondary | ICD-10-CM | POA: Diagnosis not present

## 2022-11-17 DIAGNOSIS — Z800 Family history of malignant neoplasm of digestive organs: Secondary | ICD-10-CM | POA: Diagnosis not present

## 2022-11-17 DIAGNOSIS — R945 Abnormal results of liver function studies: Secondary | ICD-10-CM | POA: Diagnosis not present

## 2022-11-17 DIAGNOSIS — R300 Dysuria: Secondary | ICD-10-CM | POA: Diagnosis not present

## 2022-11-17 DIAGNOSIS — M8580 Other specified disorders of bone density and structure, unspecified site: Secondary | ICD-10-CM | POA: Diagnosis not present

## 2022-11-17 DIAGNOSIS — E7800 Pure hypercholesterolemia, unspecified: Secondary | ICD-10-CM | POA: Diagnosis not present

## 2022-11-17 DIAGNOSIS — R932 Abnormal findings on diagnostic imaging of liver and biliary tract: Secondary | ICD-10-CM | POA: Diagnosis not present

## 2022-11-17 DIAGNOSIS — Z83719 Family history of colon polyps, unspecified: Secondary | ICD-10-CM | POA: Diagnosis not present

## 2022-11-17 DIAGNOSIS — Z881 Allergy status to other antibiotic agents status: Secondary | ICD-10-CM | POA: Diagnosis not present

## 2022-11-17 DIAGNOSIS — Z6836 Body mass index (BMI) 36.0-36.9, adult: Secondary | ICD-10-CM | POA: Diagnosis not present

## 2022-11-17 DIAGNOSIS — Z880 Allergy status to penicillin: Secondary | ICD-10-CM | POA: Diagnosis not present

## 2022-11-17 DIAGNOSIS — I10 Essential (primary) hypertension: Secondary | ICD-10-CM | POA: Diagnosis not present

## 2022-11-17 DIAGNOSIS — R109 Unspecified abdominal pain: Secondary | ICD-10-CM | POA: Diagnosis present

## 2022-11-17 DIAGNOSIS — Z79899 Other long term (current) drug therapy: Secondary | ICD-10-CM | POA: Diagnosis not present

## 2022-11-17 DIAGNOSIS — A419 Sepsis, unspecified organism: Secondary | ICD-10-CM | POA: Diagnosis present

## 2022-11-17 DIAGNOSIS — Z8042 Family history of malignant neoplasm of prostate: Secondary | ICD-10-CM | POA: Diagnosis not present

## 2022-11-17 DIAGNOSIS — Z833 Family history of diabetes mellitus: Secondary | ICD-10-CM | POA: Diagnosis not present

## 2022-11-17 DIAGNOSIS — R7989 Other specified abnormal findings of blood chemistry: Secondary | ICD-10-CM | POA: Diagnosis not present

## 2022-11-17 LAB — CBC WITH DIFFERENTIAL/PLATELET
Abs Immature Granulocytes: 0.04 10*3/uL (ref 0.00–0.07)
Basophils Absolute: 0 10*3/uL (ref 0.0–0.1)
Basophils Relative: 0 %
Eosinophils Absolute: 0.1 10*3/uL (ref 0.0–0.5)
Eosinophils Relative: 1 %
HCT: 36.7 % (ref 36.0–46.0)
Hemoglobin: 11.8 g/dL — ABNORMAL LOW (ref 12.0–15.0)
Immature Granulocytes: 1 %
Lymphocytes Relative: 4 %
Lymphs Abs: 0.2 10*3/uL — ABNORMAL LOW (ref 0.7–4.0)
MCH: 29.6 pg (ref 26.0–34.0)
MCHC: 32.2 g/dL (ref 30.0–36.0)
MCV: 92.2 fL (ref 80.0–100.0)
Monocytes Absolute: 0.5 10*3/uL (ref 0.1–1.0)
Monocytes Relative: 8 %
Neutro Abs: 5 10*3/uL (ref 1.7–7.7)
Neutrophils Relative %: 86 %
Platelets: 156 10*3/uL (ref 150–400)
RBC: 3.98 MIL/uL (ref 3.87–5.11)
RDW: 13.4 % (ref 11.5–15.5)
WBC: 5.8 10*3/uL (ref 4.0–10.5)
nRBC: 0 % (ref 0.0–0.2)

## 2022-11-17 LAB — BASIC METABOLIC PANEL
Anion gap: 9 (ref 5–15)
BUN: 13 mg/dL (ref 8–23)
CO2: 20 mmol/L — ABNORMAL LOW (ref 22–32)
Calcium: 7.9 mg/dL — ABNORMAL LOW (ref 8.9–10.3)
Chloride: 107 mmol/L (ref 98–111)
Creatinine, Ser: 0.81 mg/dL (ref 0.44–1.00)
GFR, Estimated: >60 mL/min (ref >60)
Glucose, Bld: 136 mg/dL — ABNORMAL HIGH (ref 70–99)
Potassium: 4.2 mmol/L (ref 3.5–5.1)
Sodium: 136 mmol/L (ref 135–145)

## 2022-11-17 LAB — GLUCOSE, CAPILLARY: Glucose-Capillary: 102 mg/dL — ABNORMAL HIGH (ref 70–99)

## 2022-11-17 LAB — MAGNESIUM: Magnesium: 2 mg/dL (ref 1.7–2.4)

## 2022-11-17 MED ORDER — IBUPROFEN 200 MG PO TABS
600.0000 mg | ORAL_TABLET | Freq: Once | ORAL | Status: AC | PRN
  Administered 2022-11-17: 600 mg via ORAL
  Filled 2022-11-17: qty 3

## 2022-11-17 MED ORDER — ACETAMINOPHEN 325 MG PO TABS
650.0000 mg | ORAL_TABLET | Freq: Four times a day (QID) | ORAL | Status: AC
  Administered 2022-11-17 – 2022-11-18 (×5): 650 mg via ORAL
  Filled 2022-11-17 (×5): qty 2

## 2022-11-17 MED ORDER — ONDANSETRON HCL 4 MG PO TABS: 4.0000 mg | ORAL_TABLET | Freq: Four times a day (QID) | ORAL | Status: DC | PRN

## 2022-11-17 MED ORDER — ACETAMINOPHEN 650 MG RE SUPP: 650.0000 mg | Freq: Four times a day (QID) | RECTAL | Status: DC | PRN

## 2022-11-17 MED ORDER — ROSUVASTATIN CALCIUM 10 MG PO TABS
10.0000 mg | ORAL_TABLET | Freq: Every day | ORAL | Status: DC
  Administered 2022-11-17 – 2022-11-19 (×3): 10 mg via ORAL
  Filled 2022-11-17 (×3): qty 1

## 2022-11-17 MED ORDER — ENOXAPARIN SODIUM 40 MG/0.4ML IJ SOSY
40.0000 mg | PREFILLED_SYRINGE | INTRAMUSCULAR | Status: DC
  Administered 2022-11-17 – 2022-11-19 (×3): 40 mg via SUBCUTANEOUS
  Filled 2022-11-17 (×3): qty 0.4

## 2022-11-17 MED ORDER — ONDANSETRON HCL 4 MG/2ML IJ SOLN: 4.0000 mg | Freq: Four times a day (QID) | INTRAMUSCULAR | Status: DC | PRN

## 2022-11-17 MED ORDER — SENNOSIDES-DOCUSATE SODIUM 8.6-50 MG PO TABS: 1.0000 | ORAL_TABLET | Freq: Every evening | ORAL | Status: DC | PRN

## 2022-11-17 MED ORDER — IBUPROFEN 200 MG PO TABS
400.0000 mg | ORAL_TABLET | Freq: Four times a day (QID) | ORAL | Status: DC | PRN
  Administered 2022-11-17: 400 mg via ORAL
  Filled 2022-11-17: qty 2

## 2022-11-17 MED ORDER — ACETAMINOPHEN 325 MG PO TABS: 650.0000 mg | ORAL_TABLET | Freq: Four times a day (QID) | ORAL | Status: DC | PRN

## 2022-11-17 MED ORDER — SODIUM CHLORIDE 0.9 % IV SOLN
1.0000 g | INTRAVENOUS | Status: DC
  Administered 2022-11-17: 1 g via INTRAVENOUS
  Filled 2022-11-17: qty 10

## 2022-11-17 MED ORDER — SODIUM CHLORIDE 0.9 % IV SOLN: INTRAVENOUS | Status: DC

## 2022-11-17 MED ORDER — LOSARTAN POTASSIUM 50 MG PO TABS: 50.0000 mg | ORAL_TABLET | Freq: Every day | ORAL | Status: DC

## 2022-11-17 NOTE — H&P (Signed)
PCP:   Junie Spencer, FNP   Chief Complaint:  Recurrent fever, burning urination  HPI: This is a 67 year old female with past medical history of HTN, HLD.  Approximate 1 week ago patient developed burning with urination.  Her PCP prescribed Bactrim.  Patient completed medication but the pain continued.  Patient has been febrile since Wednesday.  Tmax 103.  Patient's fever has been temporarily responsive to Tylenol.  Patient endorses shivering but no chills.  She endorses some nausea but no vomiting.  She has no appetite.  She has had right flank discomfort but no pain.  She went to [facility].  At [facility] patient febrile Tmax 102.1.  HR 106.  All other vitals normal.  Normal WBC.  Urinalysis with evidence of acute cystitis, positive hematuria.  CT abdomen pelvis without evidence of pyelonephritis. Given patient's SIRS syndrome treatment for acute cystitis initiated.  Patient given Rocephin 1 g, urine and blood cultures collected.  Admission recommended  Review of Systems:  Per HPI  Past Medical History: Past Medical History:  Diagnosis Date   Cataract    HLD (hyperlipidemia)    HTN (hypertension)    Osteopenia    Vitamin D deficiency    Past Surgical History:  Procedure Laterality Date   NO PAST SURGERIES      Medications: Prior to Admission medications   Medication Sig Start Date End Date Taking? Authorizing Provider  Ascorbic Acid (VITAMIN C) 100 MG tablet Take 100 mg by mouth daily.    [provider]  CALCIUM-MAGNESIUM-ZINC PO Take 1 capsule by mouth once a week.    [provider]  clobetasol cream (TEMOVATE) 0.05 % Apply 1 Application topically 2 (two) times daily. 08/13/22   Junie Spencer, FNP  fluticasone (FLONASE) 50 MCG/ACT nasal spray SHAKE LIQUID AND USE 2 SPRAYS IN EACH NOSTRIL DAILY 05/03/21   Rakes, Doralee Albino, FNP  losartan (COZAAR) 50 MG tablet Take 1 tablet (50 mg total) by mouth daily. 08/13/22   Junie Spencer, FNP   rosuvastatin (CRESTOR) 10 MG tablet TAKE 1 TABLET (10MG ) BY MOUTH DAILY 10/07/22   Jannifer Rodney A, FNP  sulfamethoxazole-trimethoprim (BACTRIM) 400-80 MG tablet Take 1 tablet by mouth 2 (two) times daily. 11/11/22   St Vena Austria, NP  Vitamin D, Ergocalciferol, (DRISDOL) 1.25 MG (50000 UNIT) CAPS capsule TAKE 1 CAPSULE BY MOUTH EVERY 7 DAYS 08/08/22   Junie Spencer, FNP    Allergies:   Allergies  Allergen Reactions   Other     Ranch dressing  - GI    Augmentin [Amoxicillin-Pot Clavulanate] Other (See Comments)    Causes fever on 2 nd week ( this has happened x 2 occ)     Social History:  reports that she has never smoked. She has never used smokeless tobacco. She reports that she does not drink alcohol and does not use drugs.  Family History: Family History  Problem Relation Age of Onset   Diabetes Mother    Heart disease Mother    Stroke Mother    Hypertension Mother    Hyperlipidemia Mother    Arrhythmia Father    COPD Father    Rectal cancer Father    Colon polyps Father    Atrial fibrillation Father    Hyperlipidemia Father    Prostate cancer Father    Lung disease Father        tumor on lung   Cancer Sister  Mets and don't know origin   Albinism Sister    Pulmonary fibrosis Sister    Other Sister        Hermansky pudlac syndrome   Diabetes Paternal Grandmother    Diabetes Son    Hypertension Son    Hyperlipidemia Son    Esophageal cancer Neg Hx    Stomach cancer Neg Hx    Breast cancer Neg Hx     Physical Exam: Vitals:   11/16/22 2200 11/16/22 2300 11/17/22 0030 11/17/22 0138  BP: (!) 135/58 (!) 132/59 (!) 141/54 137/65  Pulse: (!) 103 (!) 105 (!) 116 (!) 117  Resp:  17 18 20   Temp:  (!) 101.1 F (38.4 C)  (!) 101.5 F (38.6 C)  TempSrc:  Oral  Oral  SpO2: 97% 96% 96% 94%  Weight:      Height:        General:  A&O x3, well developed and nourished, no acute distress Eyes: Pink conjunctiva, no scleral icterus ENT: Moist oral  mucosa, neck supple, no thyromegaly Lungs: CTA B/L,  no use of accessory muscles Cardiovascular: Tachycardia, RRR, no regurgitation, no gallops, no murmurs. No carotid bruits, no JVD Abdomen: soft, positive BS, NTND, no flank discomfort GU: not examined Neuro: CN II - XII grossly intact, sensation intact Musculoskeletal: strength 5/5 all extremities, no clubbing, cyanosis or edema Skin: no rash, no subcutaneous crepitation, no decubitus Psych: appropriate patient  Labs on Admission:  Recent Labs    11/16/22 1450  NA 134*  K 3.9  CL 99  CO2 24  GLUCOSE 138*  BUN 16  CREATININE 1.12*  CALCIUM 8.8*   Recent Labs    11/16/22 1450  AST 49*  ALT 56*  ALKPHOS 85  BILITOT 0.5  PROT 7.6  ALBUMIN 4.0    Recent Labs    11/16/22 1450  WBC 8.8  HGB 13.7  HCT 41.5  MCV 90.2  PLT 191    Micro Results: Recent Results (from the past 240 hour(s))  Microscopic Examination     Status: Abnormal   Collection Time: 11/11/22  2:23 PM   Urine  Result Value Ref Range Status   WBC, UA 11-30 (A) 0 - 5 /hpf Final   RBC, Urine 0-2 0 - 2 /hpf Final   Epithelial Cells (non renal) 0-10 0 - 10 /hpf Final   Renal Epithel, UA None seen None seen /hpf Final   Bacteria, UA Few (A) None seen/Few Final     Radiological Exams on Admission: CT ABDOMEN PELVIS W CONTRAST  Result Date: 11/16/2022 CLINICAL DATA:  Fever and flank pain. Patient is being treated for UTI. EXAM: CT ABDOMEN AND PELVIS WITH CONTRAST TECHNIQUE: Multidetector CT imaging of the abdomen and pelvis was performed using the standard protocol following bolus administration of intravenous contrast. RADIATION DOSE REDUCTION: This exam was performed according to the departmental dose-optimization program which includes automated exposure control, adjustment of the mA and/or kV according to patient size and/or use of iterative reconstruction technique. CONTRAST:  OMNIPAQUE IOHEXOL 300 MG/ML  SOLN COMPARISON:  None Available.  FINDINGS: Lower chest: No acute abnormality. Hepatobiliary: No focal liver abnormality is seen. No gallstones, gallbladder wall thickening, or biliary dilatation. Pancreas: Unremarkable. No pancreatic ductal dilatation or surrounding inflammatory changes. Spleen: Normal in size without focal abnormality. Adrenals/Urinary Tract: Adrenal glands are unremarkable. Kidneys are normal, without renal calculi, focal lesion, or hydronephrosis. Bladder is unremarkable. Stomach/Bowel: Stomach is within normal limits. No evidence of appendicitis. No evidence of bowel  wall thickening, distention, or inflammatory changes. Vascular/Lymphatic: Aortic atherosclerosis. Shotty retroperitoneal lymph nodes. Reproductive: Uterus and bilateral adnexa are unremarkable. Other: Fat containing periumbilical anterior abdominal wall hernia. Musculoskeletal: Spondylosis of the lumbosacral spine. IMPRESSION: 1. No acute abnormality identified within the abdomen or pelvis. 2. Fat containing periumbilical anterior abdominal wall hernia. 3. Aortic atherosclerosis. Aortic Atherosclerosis (ICD10-I70.0). Electronically Signed   By: Ted Mcalpine M.D.   On: 11/16/2022 19:01    Assessment/Plan Present on Admission:  SIRS (systemic inflammatory response syndrome) (HCC)  Acute cystitis with hematuria d/t beta-hemolytic streptococcal infections  -Urine and blood cultures ordered -Continue IV Rocephin -Tylenol scheduled Q6x 5 doses to suppress recurrent fevers -PRN ibuprofen fever.  Current temperature 101.5   Pure hypercholesterolemia -Stable, Crestor resumed   Essential hypertension -Stable, losartan resumed.  Will discontinue if creatinine does not improve with IV fluid hydration  Mild AKI -IV fluid hydration -AKI could be secondary to Bactrim, infection, dehydration, ARB  Hyperglycemia -No history of diabetes.  May be due to infection.  Hemoglobin A1c and fingerstick blood sugars ordered for the a.m.  Sidharth Leverette,  Jisele Price 11/17/2022, 2:07 AM

## 2022-11-17 NOTE — Progress Notes (Signed)
Transition of Care [facility]) - Inpatient Brief Assessment   Patient Details  Name: Rola Hemsley MRN: [id number] Date of Birth: 1955-07-19  Transition of [facility]) CM/SW Contact:    Coralyn Helling, LCSW Phone Number: 11/17/2022, 11:49 AM   Clinical Narrative: No needs anticipated.    Transition of Care Asessment: Insurance and Status: (P) Insurance coverage has been reviewed Patient has primary care physician: (P) Yes Home environment has been reviewed: (P) From home with sposue Prior level of function:: (P) independent Prior/Current Home Services: (P) No current home services Social Determinants of Health Reivew: (P) SDOH reviewed no interventions necessary Readmission risk has been reviewed: (P) Yes Transition of care needs: (P) no transition of care needs at this time

## 2022-11-17 NOTE — Progress Notes (Signed)
67 yo female admitted after midnight with fever urinary frequency and dysuria.  She failed outpatient Bactrim.  She was febrile tachycardic on admission. She spiked a temp of 101.5 last night. Follow-up urine culture and blood culture continue Rocephin. Continue IV fluids.  Her blood pressure is still soft.

## 2022-11-17 NOTE — Progress Notes (Signed)
   11/17/22 0138  Assess: MEWS Score  Temp (!) 101.5 F (38.6 C)  BP 137/65  MAP (mmHg) 84  Pulse Rate (!) 117  Resp 20  SpO2 94 %  O2 Device Room Air  Assess: MEWS Score  MEWS Temp 2  MEWS Systolic 0  MEWS Pulse 2  MEWS RR 0  MEWS LOC 0  MEWS Score 4  MEWS Score Color Red  Assess: if the MEWS score is Yellow or Red  Were vital signs taken at a resting state? Yes  Focused Assessment No change from prior assessment  Does the patient meet 2 or more of the SIRS criteria? Yes  Does the patient have a confirmed or suspected source of infection? Yes  MEWS guidelines implemented  Yes, red  Treat  MEWS Interventions Considered administering scheduled or prn medications/treatments as ordered  Take Vital Signs  Increase Vital Sign Frequency  Red: Q1hr x2, continue Q4hrs until patient remains green for 12hrs  Escalate  MEWS: Escalate Red: Discuss with charge nurse and notify provider. Consider notifying RRT. If remains red for 2 hours consider need for higher level of care  Notify: Charge Nurse/RN  Name of Charge Nurse/RN Notified Kristine, RN  Provider Notification  Provider Name/Title Gery Pray, MD  Date Provider Notified 11/17/22  Notify: Rapid Response  Name of Rapid Response RN Notified Timber, RN  Date Rapid Response Notified 11/17/22  Assess: SIRS CRITERIA  SIRS Temperature  1  SIRS Pulse 1  SIRS Respirations  0  SIRS WBC 0  SIRS Score Sum  2

## 2022-11-18 DIAGNOSIS — R6510 Systemic inflammatory response syndrome (SIRS) of non-infectious origin without acute organ dysfunction: Secondary | ICD-10-CM | POA: Diagnosis not present

## 2022-11-18 LAB — HEMOGLOBIN A1C
Hgb A1c MFr Bld: 6 % — ABNORMAL HIGH (ref 4.8–5.6)
Mean Plasma Glucose: 126 mg/dL

## 2022-11-18 LAB — URINE CULTURE: Culture: <10000 — AB

## 2022-11-18 MED ORDER — FAMOTIDINE 20 MG PO TABS
40.0000 mg | ORAL_TABLET | Freq: Every day | ORAL | Status: DC
  Administered 2022-11-18 – 2022-11-19 (×2): 40 mg via ORAL
  Filled 2022-11-18 (×2): qty 2

## 2022-11-18 MED ORDER — DIPHENHYDRAMINE HCL 50 MG/ML IJ SOLN
25.0000 mg | Freq: Four times a day (QID) | INTRAMUSCULAR | Status: DC | PRN
  Administered 2022-11-19 (×2): 25 mg via INTRAVENOUS
  Filled 2022-11-18 (×2): qty 1

## 2022-11-18 MED ORDER — DIPHENHYDRAMINE HCL 50 MG/ML IJ SOLN
12.5000 mg | Freq: Once | INTRAMUSCULAR | Status: AC
  Administered 2022-11-18: 12.5 mg via INTRAVENOUS
  Filled 2022-11-18: qty 1

## 2022-11-18 MED ORDER — PREDNISONE 20 MG PO TABS: 20.0000 mg | ORAL_TABLET | Freq: Every day | ORAL | Status: DC

## 2022-11-18 MED ORDER — PREDNISONE 20 MG PO TABS
20.0000 mg | ORAL_TABLET | Freq: Every day | ORAL | Status: DC
  Administered 2022-11-18 – 2022-11-19 (×2): 20 mg via ORAL
  Filled 2022-11-18 (×2): qty 1

## 2022-11-18 NOTE — Progress Notes (Signed)
Mobility Specialist - Progress Note  During mobility: 123 bpm HR, Post-mobility: 100 bpm HR,   11/18/22 0934  Mobility  Activity Ambulated independently in hallway  Level of Assistance Independent after set-up  Assistive Device Other (Comment) (IV Pole)  Distance Ambulated (ft) 700 ft  Range of Motion/Exercises Active  Activity Response Tolerated well  Mobility Referral Yes  $Mobility charge 1 Mobility  Mobility Specialist Start Time (ACUTE ONLY) 0920  Mobility Specialist Stop Time (ACUTE ONLY) 0934  Mobility Specialist Time Calculation (min) (ACUTE ONLY) 14 min   Pt was found on recliner chair and agreeable to ambulate. No complaints throughout session and at EOS returned to bathroom.   Billey Chang Mobility Specialist

## 2022-11-18 NOTE — Progress Notes (Signed)
   11/18/22 0319  Assess: MEWS Score  Temp (!) 101.7 F (38.7 C)  BP (!) 141/62  MAP (mmHg) 81  Pulse Rate (!) 105  Resp 12  SpO2 95 %  O2 Device Room Air  Assess: MEWS Score  MEWS Temp 2  MEWS Systolic 0  MEWS Pulse 1  MEWS RR 1  MEWS LOC 0  MEWS Score 4  MEWS Score Color Red  Assess: if the MEWS score is Yellow or Red  Were vital signs taken at a resting state? Yes  Focused Assessment No change from prior assessment  Does the patient meet 2 or more of the SIRS criteria? Yes  Does the patient have a confirmed or suspected source of infection? Yes  MEWS guidelines implemented  Yes, red  Treat  MEWS Interventions Considered administering scheduled or prn medications/treatments as ordered  Take Vital Signs  Increase Vital Sign Frequency  Red: Q1hr x2, continue Q4hrs until patient remains green for 12hrs  Escalate  MEWS: Escalate Red: Discuss with charge nurse and notify provider. Consider notifying RRT. If remains red for 2 hours consider need for higher level of care  Notify: Charge Nurse/RN  Name of Charge Nurse/RN Notified Lauren, RN  Provider Notification  Provider Name/Title Chinita Greenland, NP  Date Provider Notified 11/18/22  Assess: SIRS CRITERIA  SIRS Temperature  1  SIRS Pulse 1  SIRS Respirations  0  SIRS WBC 0  SIRS Score Sum  2

## 2022-11-18 NOTE — Progress Notes (Signed)
PROGRESS NOTE    Tammy Lawson  GMW:[id number] DOB: October 16, 1955 DOA: 11/16/2022 PCP: Junie Spencer, FNP  Brief Narrative: 67 year old female with history of hypertension hyperlipidemia admitted with fever and dysuria.  She was being treated with Bactrim as an outpatient and she finished a course of Bactrim.  When she finished a course of Bactrim she developed fever and dysuria and she is admitted for further management.  She was started on Rocephin on admission and continued on 11/18/2022 she developed macular rash all over the body not itching in both lower extremities but tries at this point Rocephin was stopped and she was given Benadryl Pepcid and steroids. Her allergies to Augmentin is it causes her to have fever.  Assessment & Plan:   Principal Problem:   SIRS (systemic inflammatory response syndrome) (HCC) Active Problems:   Essential hypertension   Pure hypercholesterolemia   Acute cystitis with hematuria   Sepsis (HCC)  1 fever of unclear etiology she was initially admitted with concern for urinary tract infection urine culture has been negative no growth.  She was started on Rocephin however she developed an allergic reaction on 11/18/2022 with widespread macular rash more in the extremities and on her abdomen not itchy.  Rocephin was stopped today.  She was given Benadryl Pepcid and took prednisone.  Will monitor her overnight off of antibiotics.  She did not spike a temperature last night like the previous nights.  No blood cultures were sent.    2 AKI  resolved  3 essential hypertension blood pressure has been soft ACE inhibitor has been on hold which she takes at home prior to admission  4 hyperlipidemia on Crestor   Estimated body mass index is 36.69 kg/m as calculated from the following:   Height as of this encounter: 5\' 2"  (1.575 m).   Weight as of this encounter: 91 kg.  DVT prophylaxis: LOVENOX Code Status: FULL Family Communication: NONE Disposition Plan:   Status is: Inpatient Remains inpatient appropriate because: sirs   Consultants: none  Procedures: none Antimicrobials: none  Subjective:  C/o rash lower extremity and ob abdomen non itchy Objective: Vitals:   11/18/22 0313 11/18/22 0319 11/18/22 0401 11/18/22 0504  BP:  (!) 141/62 129/66 124/61  Pulse:  (!) 105 98 96  Resp:  12 16 16   Temp: (!) 100.7 F (38.2 C) (!) 101.7 F (38.7 C) 98.8 F (37.1 C) 98.3 F (36.8 C)  TempSrc: Oral Oral Oral Oral  SpO2:  95% 96% 95%  Weight:      Height:        Intake/Output Summary (Last 24 hours) at 11/18/2022 1344 Last data filed at 11/18/2022 1200 Gross per 24 hour  Intake 2950.07 ml  Output 0 ml  Net 2950.07 ml   Filed Weights   11/16/22 1425  Weight: 91 kg    Examination:  General exam: Appears  in nad  Respiratory system: Clear to auscultation. Respiratory effort normal. Cardiovascular system: S1 & S2 heard, RRR. No JVD, murmurs, rubs, gallops or clicks. No pedal edema. Gastrointestinal system: Abdomen is nondistended, soft and nontender. No organomegaly or masses felt. Normal bowel sounds heard. Central nervous system: Alert and oriented. No focal neurological deficits. Extremities: Symmetric 5 x 5 power. Skin: macular rash b/l le  Psychiatry: Judgement and insight appear normal. Mood & affect appropriate.     Data Reviewed: I have personally reviewed following labs and imaging studies  CBC: Recent Labs  Lab 11/16/22 1450 11/17/22 0358  WBC 8.8 5.8  NEUTROABS  --  5.0  HGB 13.7 11.8*  HCT 41.5 36.7  MCV 90.2 92.2  PLT 191 156   Basic Metabolic Panel: Recent Labs  Lab 11/16/22 1450 11/17/22 0358  NA 134* 136  K 3.9 4.2  CL 99 107  CO2 24 20*  GLUCOSE 138* 136*  BUN 16 13  CREATININE 1.12* 0.81  CALCIUM 8.8* 7.9*  MG  --  2.0   GFR: Estimated Creatinine Clearance: 69.8 mL/min (by C-G formula based on SCr of 0.81 mg/dL). Liver Function Tests: Recent Labs  Lab 11/16/22 1450  AST 49*  ALT 56*   ALKPHOS 85  BILITOT 0.5  PROT 7.6  ALBUMIN 4.0   No results for input(s): "LIPASE", "AMYLASE" in the last 168 hours. No results for input(s): "AMMONIA" in the last 168 hours. Coagulation Profile: No results for input(s): "INR", "PROTIME" in the last 168 hours. Cardiac Enzymes: No results for input(s): "CKTOTAL", "CKMB", "CKMBINDEX", "TROPONINI" in the last 168 hours. BNP (last 3 results) No results for input(s): "PROBNP" in the last 8760 hours. HbA1C: Recent Labs    11/17/22 0358  HGBA1C 6.0*   CBG: Recent Labs  Lab 11/17/22 0749  GLUCAP 102*   Lipid Profile: No results for input(s): "CHOL", "HDL", "LDLCALC", "TRIG", "CHOLHDL", "LDLDIRECT" in the last 72 hours. Thyroid Function Tests: No results for input(s): "TSH", "T4TOTAL", "FREET4", "T3FREE", "THYROIDAB" in the last 72 hours. Anemia Panel: No results for input(s): "VITAMINB12", "FOLATE", "FERRITIN", "TIBC", "IRON", "RETICCTPCT" in the last 72 hours. Sepsis Labs: Recent Labs  Lab 11/16/22 1450  LATICACIDVEN 1.1    Recent Results (from the past 240 hour(s))  Microscopic Examination     Status: Abnormal   Collection Time: 11/11/22  2:23 PM   Urine  Result Value Ref Range Status   WBC, UA 11-30 (A) 0 - 5 /hpf Final   RBC, Urine 0-2 0 - 2 /hpf Final   Epithelial Cells (non renal) 0-10 0 - 10 /hpf Final   Renal Epithel, UA None seen None seen /hpf Final   Bacteria, UA Few (A) None seen/Few Final  Urine Culture     Status: Abnormal   Collection Time: 11/16/22  2:50 PM   Specimen: Urine, Clean Catch  Result Value Ref Range Status   Specimen Description   Final    URINE, CLEAN CATCH Performed at [facility], [address] [place], [place] [address]    Special Requests   Final    NONE Performed at [facility], [address] [place], [place] [address]    Culture (A)  Final    <10,000 COLONIES/mL INSIGNIFICANT GROWTH Performed at [facility], [address] [place],  [place] [address]    Report Status 11/18/2022 FINAL  Final  Culture, blood (routine x 2)     Status: None (Preliminary result)   Collection Time: 11/16/22  4:53 PM   Specimen: BLOOD RIGHT FOREARM  Result Value Ref Range Status   Specimen Description   Final    BLOOD RIGHT FOREARM Performed at [facility], [address] [place], [place] [address]    Special Requests   Final    BOTTLES DRAWN AEROBIC AND ANAEROBIC Blood Culture results may not be optimal due to an inadequate volume of blood received in culture bottles Performed at [facility], [address] [place], [place] [address]    Culture   Final    NO GROWTH 2 DAYS  Performed at [facility], [address], [place], [place] [address]    Report Status PENDING  Incomplete  Culture, blood (routine x 2)     Status: None (Preliminary result)   Collection Time: 11/16/22  5:01 PM   Specimen: BLOOD  Result Value Ref Range Status   Specimen Description   Final    BLOOD RIGHT ANTECUBITAL Performed at [facility], [address] [place], [place] [address]    Special Requests   Final    BOTTLES DRAWN AEROBIC AND ANAEROBIC Blood Culture adequate volume Performed at [facility], [address] [place], [place] [address]    Culture   Final    NO GROWTH 2 DAYS Performed at [facility], [address], [place], [place] [address]    Report Status PENDING  Incomplete         Radiology Studies: CT ABDOMEN PELVIS W CONTRAST  Result Date: 11/16/2022 CLINICAL DATA:  Fever and flank pain. Patient is being treated for UTI. EXAM: CT ABDOMEN AND PELVIS WITH CONTRAST TECHNIQUE: Multidetector CT imaging of the abdomen and pelvis was performed using the standard protocol following bolus administration of intravenous contrast. RADIATION DOSE REDUCTION: This exam was performed according to the departmental dose-optimization program which includes automated exposure control, adjustment of the  mA and/or kV according to patient size and/or use of iterative reconstruction technique. CONTRAST:  OMNIPAQUE IOHEXOL 300 MG/ML  SOLN COMPARISON:  None Available. FINDINGS: Lower chest: No acute abnormality. Hepatobiliary: No focal liver abnormality is seen. No gallstones, gallbladder wall thickening, or biliary dilatation. Pancreas: Unremarkable. No pancreatic ductal dilatation or surrounding inflammatory changes. Spleen: Normal in size without focal abnormality. Adrenals/Urinary Tract: Adrenal glands are unremarkable. Kidneys are normal, without renal calculi, focal lesion, or hydronephrosis. Bladder is unremarkable. Stomach/Bowel: Stomach is within normal limits. No evidence of appendicitis. No evidence of bowel wall thickening, distention, or inflammatory changes. Vascular/Lymphatic: Aortic atherosclerosis. Shotty retroperitoneal lymph nodes. Reproductive: Uterus and bilateral adnexa are unremarkable. Other: Fat containing periumbilical anterior abdominal wall hernia. Musculoskeletal: Spondylosis of the lumbosacral spine. IMPRESSION: 1. No acute abnormality identified within the abdomen or pelvis. 2. Fat containing periumbilical anterior abdominal wall hernia. 3. Aortic atherosclerosis. Aortic Atherosclerosis (ICD10-I70.0). Electronically Signed   By: Ted Mcalpine M.D.   On: 11/16/2022 19:01        Scheduled Meds:  enoxaparin (LOVENOX) injection  40 mg Subcutaneous Q24H   famotidine  40 mg Oral Daily   predniSONE  20 mg Oral Q breakfast   rosuvastatin  10 mg Oral Daily   Continuous Infusions:  sodium chloride 100 mL/hr at 11/18/22 1341     LOS: 1 day    Time spent: 37 min  Alwyn Ren, MD 11/18/2022, 1:44 PM

## 2022-11-19 ENCOUNTER — Inpatient Hospital Stay (HOSPITAL_COMMUNITY): Payer: PPO

## 2022-11-19 DIAGNOSIS — R21 Rash and other nonspecific skin eruption: Secondary | ICD-10-CM

## 2022-11-19 DIAGNOSIS — R509 Fever, unspecified: Secondary | ICD-10-CM

## 2022-11-19 DIAGNOSIS — R7989 Other specified abnormal findings of blood chemistry: Secondary | ICD-10-CM

## 2022-11-19 DIAGNOSIS — R6510 Systemic inflammatory response syndrome (SIRS) of non-infectious origin without acute organ dysfunction: Secondary | ICD-10-CM | POA: Diagnosis not present

## 2022-11-19 DIAGNOSIS — R300 Dysuria: Secondary | ICD-10-CM

## 2022-11-19 LAB — COMPREHENSIVE METABOLIC PANEL
ALT: 119 U/L — ABNORMAL HIGH (ref 0–44)
AST: 82 U/L — ABNORMAL HIGH (ref 15–41)
Albumin: 2.6 g/dL — ABNORMAL LOW (ref 3.5–5.0)
Alkaline Phosphatase: 204 U/L — ABNORMAL HIGH (ref 38–126)
Anion gap: 7 (ref 5–15)
BUN: 11 mg/dL (ref 8–23)
CO2: 20 mmol/L — ABNORMAL LOW (ref 22–32)
Calcium: 7.7 mg/dL — ABNORMAL LOW (ref 8.9–10.3)
Chloride: 110 mmol/L (ref 98–111)
Creatinine, Ser: 0.71 mg/dL (ref 0.44–1.00)
GFR, Estimated: >60 mL/min (ref >60)
Glucose, Bld: 98 mg/dL (ref 70–99)
Potassium: 3.6 mmol/L (ref 3.5–5.1)
Sodium: 137 mmol/L (ref 135–145)
Total Bilirubin: 0.4 mg/dL (ref 0.3–1.2)
Total Protein: 5.8 g/dL — ABNORMAL LOW (ref 6.5–8.1)

## 2022-11-19 LAB — CBC
HCT: 32.7 % — ABNORMAL LOW (ref 36.0–46.0)
Hemoglobin: 10.4 g/dL — ABNORMAL LOW (ref 12.0–15.0)
MCH: 29.3 pg (ref 26.0–34.0)
MCHC: 31.8 g/dL (ref 30.0–36.0)
MCV: 92.1 fL (ref 80.0–100.0)
Platelets: 157 10*3/uL (ref 150–400)
RBC: 3.55 MIL/uL — ABNORMAL LOW (ref 3.87–5.11)
RDW: 13.7 % (ref 11.5–15.5)
WBC: 6.5 10*3/uL (ref 4.0–10.5)
nRBC: 0 % (ref 0.0–0.2)

## 2022-11-19 LAB — DIFFERENTIAL
Abs Immature Granulocytes: 0.04 10*3/uL (ref 0.00–0.07)
Basophils Absolute: 0 10*3/uL (ref 0.0–0.1)
Basophils Relative: 0 %
Eosinophils Absolute: 0 10*3/uL (ref 0.0–0.5)
Eosinophils Relative: 1 %
Immature Granulocytes: 1 %
Lymphocytes Relative: 12 %
Lymphs Abs: 0.9 10*3/uL (ref 0.7–4.0)
Monocytes Absolute: 0.3 10*3/uL (ref 0.1–1.0)
Monocytes Relative: 4 %
Neutro Abs: 5.7 10*3/uL (ref 1.7–7.7)
Neutrophils Relative %: 82 %

## 2022-11-19 MED ORDER — FAMOTIDINE 40 MG PO TABS: 40.0000 mg | ORAL_TABLET | Freq: Every day | ORAL | 0 refills | Status: DC

## 2022-11-19 MED ORDER — HYDROXYZINE HCL 10 MG PO TABS: 10.0000 mg | ORAL_TABLET | Freq: Three times a day (TID) | ORAL | 0 refills | Status: DC

## 2022-11-19 MED ORDER — SENNOSIDES-DOCUSATE SODIUM 8.6-50 MG PO TABS: 1.0000 | ORAL_TABLET | Freq: Every evening | ORAL | Status: DC | PRN

## 2022-11-19 MED ORDER — DIPHENHYDRAMINE HCL 50 MG PO TABS: 25.0000 mg | ORAL_TABLET | Freq: Three times a day (TID) | ORAL | 0 refills | Status: DC | PRN

## 2022-11-19 MED ORDER — PREDNISONE 20 MG PO TABS: 20.0000 mg | ORAL_TABLET | Freq: Every day | ORAL | 0 refills | Status: DC

## 2022-11-19 NOTE — Consult Note (Addendum)
[facility]    Date of Admission:  11/16/2022   Total days of inpatient antibiotics 2        Reason for Consult: Fever    Principal Problem:   SIRS (systemic inflammatory response syndrome) (HCC) Active Problems:   Essential hypertension   Pure hypercholesterolemia   Acute cystitis with hematuria   Sepsis [facility])   Assessment: 67 year old female admitted with: #Dysuria, presumed acute cystitis with fevers #Fever secondary to possible drug rash versus UTI #History of Augmentin allergy - Started on ceftriaxone on admission 7/13 stopped on 7/14 due to allergic reaction.  She notes that she had a rash that started on 7/15 on her arms and will legs and abdomen.  Images in media tab.  She notes that rash is not pruritic.  She denies having similar rash in the past. - She has been afebrile since 7/14 - ID engaged for further workup.  Review EMR looks like she was prescribed Bactrim for 7-day supply on 11/11/2022 for UTI by PCP.  7/13 UA negative nitrites/leukocytes, urine culture insignificant growth.   -Spoke to patient today and she notes that on admission her dysuria had resolved.  The reason she came to the hospital was due to high fevers.  She notes that has a similar phenomena occurs with Augmentin.  She states she has taken Augmetin for strep throat before and  develops high fevers a few days into course of abx.  Suspect patient had a UTI given her dysuria outpatient which was treated with bactrim.  I think the fevers might have been a drug reaction secondary to Bactrim.  She does have a papular rash that is seems persistent with a drug rash.  It is unclear if it is to ceftriaxone or Bactrim.  I think likely it was secondary to Bactrim as she has had 5 doses of the antibiotic leading to fever then rash.  She does not recall prior Bactrim or ceftriaxone exposure.  She had no shortness of breath with antibiotics. Recommendations:  -No further antibiotics  needed. Diff added to cbc.  No peripheral eiosinophilia on 7/14 - ID will sign off  # Elevated LFTs - Ultrasound pending - Acute hepatitis panel -Suspect elevated LFTs are in the setting of fever/drug reaction Microbiology:   Antibiotics: Cts 7/13/7/14  Cultures: Blood 7/13 nG Urine 7/13<10k colonies   HPI: Tammy Lawson is a 67 y.o. female with past medical history of hypertension, hyperlipidemia.  Presented with dysuria x 1 week.  On arrival she had temp of 103, blood cultures negative.  Initially started on ceftriaxone.  CT abdomen pelvis did not show signs of infection.  Ceftriaxone was stopped on 7/14 due to allergic reaction.  She has been afebrile since 7/14.  ID engaged for antibiotic recommendations.   Review of Systems: Review of Systems  All other systems reviewed and are negative.   Past Medical History:  Diagnosis Date   Cataract    HLD (hyperlipidemia)    HTN (hypertension)    Osteopenia    Vitamin D deficiency     Social History   Tobacco Use   Smoking status: Never   Smokeless tobacco: Never  Vaping Use   Vaping status: Never Used  Substance Use Topics   Alcohol use: No   Drug use: No    Family History  Problem Relation Age of Onset   Diabetes Mother    Heart disease Mother    Stroke Mother  Hypertension Mother    Hyperlipidemia Mother    Arrhythmia Father    COPD Father    Rectal cancer Father    Colon polyps Father    Atrial fibrillation Father    Hyperlipidemia Father    Prostate cancer Father    Lung disease Father        tumor on lung   Cancer Sister        Mets and don't know origin   Albinism Sister    Pulmonary fibrosis Sister    Other Sister        Hermansky pudlac syndrome   Diabetes Paternal Grandmother    Diabetes Son    Hypertension Son    Hyperlipidemia Son    Esophageal cancer Neg Hx    Stomach cancer Neg Hx    Breast cancer Neg Hx    Scheduled Meds:  enoxaparin (LOVENOX) injection  40 mg Subcutaneous Q24H    famotidine  40 mg Oral Daily   predniSONE  20 mg Oral Q breakfast   rosuvastatin  10 mg Oral Daily   Continuous Infusions:  sodium chloride 100 mL/hr at 11/19/22 0950   PRN Meds:.diphenhydrAMINE, ibuprofen, ondansetron **OR** ondansetron (ZOFRAN) IV, senna-docusate Allergies  Allergen Reactions   Other     Ranch dressing  - GI    Augmentin [Amoxicillin-Pot Clavulanate] Other (See Comments)    Causes fever on 2 nd week ( this has happened x 2 occ)     OBJECTIVE: Blood pressure (!) 143/69, pulse 78, temperature 98.5 F (36.9 C), temperature source Oral, resp. rate 19, height 5\' 2"  (1.575 m), weight 91 kg, SpO2 99%.  Physical Exam Constitutional:      Appearance: Normal appearance.  HENT:     Head: Normocephalic and atraumatic.     Right Ear: Tympanic membrane normal.     Left Ear: Tympanic membrane normal.     Nose: Nose normal.     Mouth/Throat:     Mouth: Mucous membranes are moist.  Eyes:     Extraocular Movements: Extraocular movements intact.     Conjunctiva/sclera: Conjunctivae normal.     Pupils: Pupils are equal, round, and reactive to light.  Cardiovascular:     Rate and Rhythm: Normal rate and regular rhythm.     Heart sounds: No murmur heard.    No friction rub. No gallop.  Pulmonary:     Effort: Pulmonary effort is normal.     Breath sounds: Normal breath sounds.  Abdominal:     General: Abdomen is flat.     Palpations: Abdomen is soft.  Musculoskeletal:        General: Normal range of motion.  Skin:    General: Skin is warm and dry.  Neurological:     General: No focal deficit present.     Mental Status: She is alert and oriented to person, place, and time.  Psychiatric:        Mood and Affect: Mood normal.        Lab Results Lab Results  Component Value Date   WBC 6.5 11/19/2022   HGB 10.4 (L) 11/19/2022   HCT 32.7 (L) 11/19/2022   MCV 92.1 11/19/2022   PLT 157 11/19/2022    Lab Results  Component Value Date   CREATININE 0.71  11/19/2022   BUN 11 11/19/2022   NA 137 11/19/2022   K 3.6 11/19/2022   CL 110 11/19/2022   CO2 20 (L) 11/19/2022    Lab Results  Component Value Date  ALT 119 (H) 11/19/2022   AST 82 (H) 11/19/2022   ALKPHOS 204 (H) 11/19/2022   BILITOT 0.4 11/19/2022       Danelle Earthly, MD [facility] 11/19/2022, 1:54 PM   I have personally spent 85 minutes involved in face-to-face and non-face-to-face activities for this patient on the day of the visit. Professional time spent includes the following activities: Preparing to see the patient (review of tests), Obtaining and/or reviewing separately obtained history (admission/discharge record), Performing a medically appropriate examination and/or evaluation , Ordering medications/tests/procedures, referring and communicating with other health care professionals, Documenting clinical information in the EMR, Independently interpreting results (not separately reported), Communicating results to the patient/family/caregiver, Counseling and educating the patient/family/caregiver and Care coordination (not separately reported).

## 2022-11-19 NOTE — Discharge Summary (Signed)
Physician Discharge Summary  Tammy Lawson UJW:[id number] DOB: 03/28/1956 DOA: 11/16/2022  PCP: Tammy Spencer, FNP  Admit date: 11/16/2022 Discharge date: 11/19/2022  Admitted From: home Disposition:  home  Recommendations for Outpatient Follow-up:  Follow up with PCP in 1-2 weeks follow up on abdominal ultrasound which was pending at the time of discharge Please obtain BMP/CBC in one week  Home Health: None Equipment/Devices: None  Discharge Condition: Stable CODE STATUS: Full code Diet recommendation: Cardiac  Brief/Interim Summary:: 67 year old female with history of hypertension hyperlipidemia admitted with fever and dysuria.  She was being treated with Bactrim as an outpatient and she finished a course of Bactrim.  When she finished a course of Bactrim she developed fever and dysuria and she is admitted for further management.  She was started on Rocephin on admission and continued on 11/18/2022 she developed macular rash all over the body not itching in both lower extremities but tries at this point Rocephin was stopped and she was given Benadryl Pepcid and steroids. Her allergies to Augmentin is it causes her to have fever.   Discharge Diagnoses:  Principal Problem:   SIRS (systemic inflammatory response syndrome) (HCC) Active Problems:   Essential hypertension   Pure hypercholesterolemia   Acute cystitis with hematuria   Sepsis (HCC)    1 Sepsis ruled out.  She was initially admitted with a diagnosis of cystitis.  Urine culture came back with no growth.  She was being treated as an outpatient with Bactrim which she finished the course prior to admission to hospital.  When she was admitted she had fever and she told me she had dysuria but she did tell the ID physician she did not have dysuria.  She was started on Rocephin on the second day of Rocephin she developed maculopapular rash which was known itchy all over her body.  At that point Rocephin was stopped.  ID was consulted  they were not sure if this rash was from the Bactrim or from Rocephin.  However they thought  rash is more likely from Bactrim.  She was treated with Pepcid prednisone and Benadryl.  She will be discharged home on Atarax Pepcid and prednisone.  She will follow-up with her primary physician in 1 week.  An abdominal ultrasound was ordered due to elevated LFTs which has been pending at the time of discharge.  She was afebrile for 2 days prior to discharge.  Cultures remain negative throughout the hospital stay.      2 AKI  resolved   3 essential hypertension continue ACE inhibitor.  4 hyperlipidemia on Crestor   Estimated body mass index is 36.69 kg/m as calculated from the following:   Height as of this encounter: 5\' 2"  (1.575 m).   Weight as of this encounter: 91 kg.  Discharge Instructions  Discharge Instructions     Diet - low sodium heart healthy   Complete by: As directed    Increase activity slowly   Complete by: As directed       Allergies as of 11/19/2022       Reactions   Other    Ranch dressing  - GI    Augmentin [amoxicillin-pot Clavulanate] Other (See Comments)   Causes fever on 2 nd week ( this has happened x 2 occ)         Medication List     STOP taking these medications    sulfamethoxazole-trimethoprim 400-80 MG tablet Commonly known as: Bactrim       TAKE these  medications    acetaminophen 500 MG tablet Commonly known as: TYLENOL Take 500 mg by mouth every 6 (six) hours as needed for moderate pain, mild pain, fever or headache.   CALCIUM-MAGNESIUM-ZINC PO Take 1 capsule by mouth once a week.   clobetasol cream 0.05 % Commonly known as: TEMOVATE Apply 1 Application topically 2 (two) times daily. What changed: when to take this   famotidine 40 MG tablet Commonly known as: PEPCID Take 1 tablet (40 mg total) by mouth daily. Start taking on: November 20, 2022   fluticasone 50 MCG/ACT nasal spray Commonly known as: FLONASE SHAKE LIQUID AND USE 2  SPRAYS IN EACH NOSTRIL DAILY   hydrOXYzine 10 MG tablet Commonly known as: ATARAX Take 1 tablet (10 mg total) by mouth 3 (three) times daily.   losartan 50 MG tablet Commonly known as: COZAAR Take 1 tablet (50 mg total) by mouth daily.   phenazopyridine 95 MG tablet Commonly known as: PYRIDIUM Take 95 mg by mouth 3 (three) times daily as needed for pain.   predniSONE 20 MG tablet Commonly known as: DELTASONE Take 1 tablet (20 mg total) by mouth daily with breakfast. Start taking on: November 20, 2022   rosuvastatin 10 MG tablet Commonly known as: CRESTOR TAKE 1 TABLET (10MG ) BY MOUTH DAILY   senna-docusate 8.6-50 MG tablet Commonly known as: Senokot-S Take 1 tablet by mouth at bedtime as needed for mild constipation.   vitamin C 100 MG tablet Take 100 mg by mouth daily.   Vitamin D (Ergocalciferol) 1.25 MG (50000 UNIT) Caps capsule Commonly known as: DRISDOL TAKE 1 CAPSULE BY MOUTH EVERY 7 DAYS        Follow-up Information     Tammy Spencer, FNP Follow up.   Specialty: Family Medicine Why: please follow up on ultrasound abdominal Contact information: [address] [place] [address] [phone]                Allergies  Allergen Reactions   Other     Ranch dressing  - GI    Augmentin [Amoxicillin-Pot Clavulanate] Other (See Comments)    Causes fever on 2 nd week ( this has happened x 2 occ)     Consultations: Infectious disease   Procedures/Studies: CT ABDOMEN PELVIS W CONTRAST  Result Date: 11/16/2022 CLINICAL DATA:  Fever and flank pain. Patient is being treated for UTI. EXAM: CT ABDOMEN AND PELVIS WITH CONTRAST TECHNIQUE: Multidetector CT imaging of the abdomen and pelvis was performed using the standard protocol following bolus administration of intravenous contrast. RADIATION DOSE REDUCTION: This exam was performed according to the departmental dose-optimization program which includes automated exposure control, adjustment of the mA  and/or kV according to patient size and/or use of iterative reconstruction technique. CONTRAST:  OMNIPAQUE IOHEXOL 300 MG/ML  SOLN COMPARISON:  None Available. FINDINGS: Lower chest: No acute abnormality. Hepatobiliary: No focal liver abnormality is seen. No gallstones, gallbladder wall thickening, or biliary dilatation. Pancreas: Unremarkable. No pancreatic ductal dilatation or surrounding inflammatory changes. Spleen: Normal in size without focal abnormality. Adrenals/Urinary Tract: Adrenal glands are unremarkable. Kidneys are normal, without renal calculi, focal lesion, or hydronephrosis. Bladder is unremarkable. Stomach/Bowel: Stomach is within normal limits. No evidence of appendicitis. No evidence of bowel wall thickening, distention, or inflammatory changes. Vascular/Lymphatic: Aortic atherosclerosis. Shotty retroperitoneal lymph nodes. Reproductive: Uterus and bilateral adnexa are unremarkable. Other: Fat containing periumbilical anterior abdominal wall hernia. Musculoskeletal: Spondylosis of the lumbosacral spine. IMPRESSION: 1. No acute abnormality identified within the abdomen or  pelvis. 2. Fat containing periumbilical anterior abdominal wall hernia. 3. Aortic atherosclerosis. Aortic Atherosclerosis (ICD10-I70.0). Electronically Signed   By: Ted Mcalpine M.D.   On: 11/16/2022 19:01   ECHOCARDIOGRAM COMPLETE  Result Date: 10/24/2022    ECHOCARDIOGRAM REPORT   Patient Name:   BRYNLI OLLIS  Date of Exam: 10/24/2022 Medical Rec #:  [id number]     Height:       62.0 in Accession #:    [id number]    Weight:       203.2 lb Date of Birth:  Oct 02, 1955      BSA:          1.925 m Patient Age:    67 years      BP:           134/82 mmHg Patient Gender: F             HR:           69 bpm. Exam Location:  [address] Procedure: 2D Echo, Cardiac Doppler, Color Doppler, 3D Echo and Strain Analysis Indications:    I45.10 RBBB  History:        Patient has no prior history of Echocardiogram examinations.                  Abnormal ECG; Risk Factors:Hypertension, Dyslipidemia and Family                 History of Coronary Artery Disease. Obesity. Prediabetes.  Sonographer:    Cathie Beams RCS Referring Phys: [id number] Ronnald Ramp O'NEAL IMPRESSIONS  1. Left ventricular ejection fraction, by estimation, is 60 to 65%. The left ventricle has normal function. The left ventricle has no regional wall motion abnormalities. Left ventricular diastolic parameters were normal. The average left ventricular global longitudinal strain is -20.0 %. The global longitudinal strain is normal.  2. Right ventricular systolic function is normal. The right ventricular size is normal.  3. The mitral valve is normal in structure. Trivial mitral valve regurgitation. No evidence of mitral stenosis.  4. The aortic valve is tricuspid. Aortic valve regurgitation is trivial. No aortic stenosis is present.  5. The inferior vena cava is normal in size with greater than 50% respiratory variability, suggesting right atrial pressure of 3 mmHg. Comparison(s): No prior Echocardiogram. Conclusion(s)/Recommendation(s): Normal biventricular function without evidence of hemodynamically significant valvular heart disease. FINDINGS  Left Ventricle: Left ventricular ejection fraction, by estimation, is 60 to 65%. The left ventricle has normal function. The left ventricle has no regional wall motion abnormalities. The average left ventricular global longitudinal strain is -20.0 %. The global longitudinal strain is normal. The left ventricular internal cavity size was normal in size. There is no left ventricular hypertrophy. Left ventricular diastolic parameters were normal. Right Ventricle: The right ventricular size is normal. No increase in right ventricular wall thickness. Right ventricular systolic function is normal. Left Atrium: Left atrial size was normal in size. Right Atrium: Right atrial size was normal in size. Pericardium: There is no evidence of  pericardial effusion. Mitral Valve: The mitral valve is normal in structure. Trivial mitral valve regurgitation. No evidence of mitral valve stenosis. Tricuspid Valve: The tricuspid valve is normal in structure. Tricuspid valve regurgitation is not demonstrated. No evidence of tricuspid stenosis. Aortic Valve: The aortic valve is tricuspid. Aortic valve regurgitation is trivial. No aortic stenosis is present. Pulmonic Valve: The pulmonic valve was not well visualized. Pulmonic valve regurgitation is not visualized. No evidence of pulmonic stenosis. Aorta: The aortic root,  ascending aorta, aortic arch and descending aorta are all structurally normal, with no evidence of dilitation or obstruction. Venous: The inferior vena cava is normal in size with greater than 50% respiratory variability, suggesting right atrial pressure of 3 mmHg. IAS/Shunts: The atrial septum is grossly normal.  LEFT VENTRICLE PLAX 2D LVIDd:         3.70 cm   Diastology LVIDs:         2.40 cm   LV e' medial:    8.38 cm/s LV PW:         1.30 cm   LV E/e' medial:  9.2 LV IVS:        0.90 cm   LV e' lateral:   10.00 cm/s LVOT diam:     2.00 cm   LV E/e' lateral: 7.7 LV SV:         58 LV SV Index:   30        2D Longitudinal Strain LVOT Area:     3.14 cm  2D Strain GLS (A2C):   -22.0 %                          2D Strain GLS (A3C):   -19.1 %                          2D Strain GLS (A4C):   -18.8 %                          2D Strain GLS Avg:     -20.0 %                           3D Volume EF:                          3D EF:        59 %                          LV EDV:       148 ml                          LV ESV:       61 ml                          LV SV:        88 ml RIGHT VENTRICLE RV Basal diam:  3.10 cm RV S prime:     10.60 cm/s TAPSE (M-mode): 2.3 cm LEFT ATRIUM           Index        RIGHT ATRIUM          Index LA diam:      2.80 cm 1.45 cm/m   RA Area:     9.50 cm LA Vol (A2C): 31.3 ml 16.26 ml/m  RA Volume:   18.80 ml 9.77 ml/m LA Vol  (A4C): 22.7 ml 11.79 ml/m  AORTIC VALVE LVOT Vmax:   90.20 cm/s LVOT Vmean:  59.700 cm/s LVOT VTI:    0.186 m  AORTA Ao Root diam: 3.30 cm Ao Asc diam:  3.20 cm MITRAL VALVE MV Area (PHT): 3.30 cm    SHUNTS MV  Decel Time: 230 msec    Systemic VTI:  0.19 m MV E velocity: 77.00 cm/s  Systemic Diam: 2.00 cm MV A velocity: 90.10 cm/s MV E/A ratio:  0.85 Jodelle Red MD Electronically signed by Jodelle Red MD Signature Date/Time: 10/24/2022/9:16:28 PM    Final    (Echo, Carotid, EGD, Colonoscopy, ERCP)    Subjective:  Patient is anxious to go home I offered her to stay another night Discharge Exam: Vitals:   11/19/22 0350 11/19/22 1327  BP: 135/79 (!) 143/69  Pulse: 87 78  Resp:  19  Temp: 98.2 F (36.8 C) 98.5 F (36.9 C)  SpO2: 98% 99%   Vitals:   11/18/22 1536 11/18/22 2059 11/19/22 0350 11/19/22 1327  BP: 134/65 (!) 150/72 135/79 (!) 143/69  Pulse: 88 90 87 78  Resp:  18  19  Temp: 99.2 F (37.3 C) 98.4 F (36.9 C) 98.2 F (36.8 C) 98.5 F (36.9 C)  TempSrc: Oral Oral Oral Oral  SpO2: 97% 98% 98% 99%  Weight:      Height:        General: Pt is alert, awake, not in acute distress Cardiovascular: RRR, S1/S2 +, no rubs, no gallops Respiratory: CTA bilaterally, no wheezing, no rhonchi Abdominal: Soft, NT, ND, bowel sounds + Extremities: Macular papular rash in the lower lower extremities and abdomen noted  The results of significant diagnostics from this hospitalization (including imaging, microbiology, ancillary and laboratory) are listed below for reference.     Microbiology: Recent Results (from the past 240 hour(s))  Microscopic Examination     Status: Abnormal   Collection Time: 11/11/22  2:23 PM   Urine  Result Value Ref Range Status   WBC, UA 11-30 (A) 0 - 5 /hpf Final   RBC, Urine 0-2 0 - 2 /hpf Final   Epithelial Cells (non renal) 0-10 0 - 10 /hpf Final   Renal Epithel, UA None seen None seen /hpf Final   Bacteria, UA Few (A) None seen/Few  Final  Urine Culture     Status: Abnormal   Collection Time: 11/16/22  2:50 PM   Specimen: Urine, Clean Catch  Result Value Ref Range Status   Specimen Description   Final    URINE, CLEAN CATCH Performed at [facility], [address] [place], [place] [address]    Special Requests   Final    NONE Performed at [facility], [address] [place], [place] [address]    Culture (A)  Final    <10,000 COLONIES/mL INSIGNIFICANT GROWTH Performed at [facility], [address], [place], [place] [address]    Report Status 11/18/2022 FINAL  Final  Culture, blood (routine x 2)     Status: None (Preliminary result)   Collection Time: 11/16/22  4:53 PM   Specimen: BLOOD RIGHT FOREARM  Result Value Ref Range Status   Specimen Description   Final    BLOOD RIGHT FOREARM Performed at [facility], [address] [place], [place] [address]    Special Requests   Final    BOTTLES DRAWN AEROBIC AND ANAEROBIC Blood Culture results may not be optimal due to an inadequate volume of blood received in culture bottles Performed at [facility], [address] [place], [place] [address]    Culture   Final    NO GROWTH 3 DAYS Performed at [facility], [address], [place], [place] [address]  Report Status PENDING  Incomplete  Culture, blood (routine x 2)     Status: None (Preliminary result)   Collection Time: 11/16/22  5:01 PM   Specimen: BLOOD  Result Value Ref Range Status   Specimen Description   Final    BLOOD RIGHT ANTECUBITAL Performed at [facility], [address] [place], [place] [address]    Special Requests   Final    BOTTLES DRAWN AEROBIC AND ANAEROBIC Blood Culture adequate volume Performed at [facility], [address] [place], [place] [address]    Culture   Final    NO GROWTH 3 DAYS Performed at [facility], [address] [place], [place] [address]    Report Status PENDING   Incomplete     Labs: BNP (last 3 results) No results for input(s): "BNP" in the last 8760 hours. Basic Metabolic Panel: Recent Labs  Lab 11/16/22 1450 11/17/22 0358 11/19/22 0741  NA 134* 136 137  K 3.9 4.2 3.6  CL 99 107 110  CO2 24 20* 20*  GLUCOSE 138* 136* 98  BUN 16 13 11   CREATININE 1.12* 0.81 0.71  CALCIUM 8.8* 7.9* 7.7*  MG  --  2.0  --    Liver Function Tests: Recent Labs  Lab 11/16/22 1450 11/19/22 0741  AST 49* 82*  ALT 56* 119*  ALKPHOS 85 204*  BILITOT 0.5 0.4  PROT 7.6 5.8*  ALBUMIN 4.0 2.6*   No results for input(s): "LIPASE", "AMYLASE" in the last 168 hours. No results for input(s): "AMMONIA" in the last 168 hours. CBC: Recent Labs  Lab 11/16/22 1450 11/17/22 0358 11/19/22 0741  WBC 8.8 5.8 6.5  NEUTROABS  --  5.0 5.7  HGB 13.7 11.8* 10.4*  HCT 41.5 36.7 32.7*  MCV 90.2 92.2 92.1  PLT 191 156 157   Cardiac Enzymes: No results for input(s): "CKTOTAL", "CKMB", "CKMBINDEX", "TROPONINI" in the last 168 hours. BNP: Invalid input(s): "POCBNP" CBG: Recent Labs  Lab 11/17/22 0749  GLUCAP 102*   D-Dimer No results for input(s): "DDIMER" in the last 72 hours. Hgb A1c Recent Labs    11/17/22 0358  HGBA1C 6.0*   Lipid Profile No results for input(s): "CHOL", "HDL", "LDLCALC", "TRIG", "CHOLHDL", "LDLDIRECT" in the last 72 hours. Thyroid function studies No results for input(s): "TSH", "T4TOTAL", "T3FREE", "THYROIDAB" in the last 72 hours.  Invalid input(s): "FREET3" Anemia work up No results for input(s): "VITAMINB12", "FOLATE", "FERRITIN", "TIBC", "IRON", "RETICCTPCT" in the last 72 hours. Urinalysis    Component Value Date/Time   COLORURINE YELLOW 11/16/2022 1450   APPEARANCEUR CLOUDY (A) 11/16/2022 1450   APPEARANCEUR Clear 11/11/2022 1423   LABSPEC 1.025 11/16/2022 1450   PHURINE 5.5 11/16/2022 1450   GLUCOSEU NEGATIVE 11/16/2022 1450   HGBUR MODERATE (A) 11/16/2022 1450   BILIRUBINUR NEGATIVE 11/16/2022 1450   BILIRUBINUR  Negative 11/11/2022 1423   KETONESUR NEGATIVE 11/16/2022 1450   PROTEINUR 100 (A) 11/16/2022 1450   NITRITE NEGATIVE 11/16/2022 1450   LEUKOCYTESUR NEGATIVE 11/16/2022 1450   Sepsis Labs Recent Labs  Lab 11/16/22 1450 11/17/22 0358 11/19/22 0741  WBC 8.8 5.8 6.5   Microbiology Recent Results (from the past 240 hour(s))  Microscopic Examination     Status: Abnormal   Collection Time: 11/11/22  2:23 PM   Urine  Result Value Ref Range Status   WBC, UA 11-30 (A) 0 - 5 /hpf Final   RBC, Urine 0-2 0 - 2 /hpf Final   Epithelial Cells (  non renal) 0-10 0 - 10 /hpf Final   Renal Epithel, UA None seen None seen /hpf Final   Bacteria, UA Few (A) None seen/Few Final  Urine Culture     Status: Abnormal   Collection Time: 11/16/22  2:50 PM   Specimen: Urine, Clean Catch  Result Value Ref Range Status   Specimen Description   Final    URINE, CLEAN CATCH Performed at [facility], [address] [place], [place] [address]    Special Requests   Final    NONE Performed at [facility], [address] [place], [place] [address]    Culture (A)  Final    <10,000 COLONIES/mL INSIGNIFICANT GROWTH Performed at [facility], [address] [place], [place] [address]    Report Status 11/18/2022 FINAL  Final  Culture, blood (routine x 2)     Status: None (Preliminary result)   Collection Time: 11/16/22  4:53 PM   Specimen: BLOOD RIGHT FOREARM  Result Value Ref Range Status   Specimen Description   Final    BLOOD RIGHT FOREARM Performed at [facility], [address] [place], [place] [address]    Special Requests   Final    BOTTLES DRAWN AEROBIC AND ANAEROBIC Blood Culture results may not be optimal due to an inadequate volume of blood received in culture bottles Performed at [facility], [address] [place], [place] [address]    Culture   Final    NO GROWTH 3 DAYS Performed at [facility], [address], [place],  [place] [address]    Report Status PENDING  Incomplete  Culture, blood (routine x 2)     Status: None (Preliminary result)   Collection Time: 11/16/22  5:01 PM   Specimen: BLOOD  Result Value Ref Range Status   Specimen Description   Final    BLOOD RIGHT ANTECUBITAL Performed at [facility], [address] [place], [place] [address]    Special Requests   Final    BOTTLES DRAWN AEROBIC AND ANAEROBIC Blood Culture adequate volume Performed at [facility], [address] [place], [place] [address]    Culture   Final    NO GROWTH 3 DAYS Performed at [facility], [address] [place], [place] [address]    Report Status PENDING  Incomplete     Time coordinating discharge:  38 minutes  SIGNED:  Alwyn Ren, MD  [facility] 11/19/2022, 5:05 PM

## 2022-11-20 LAB — HEPATITIS PANEL, ACUTE
HCV Ab: NONREACTIVE
Hep A IgM: NONREACTIVE
Hep B C IgM: NONREACTIVE
Hepatitis B Surface Ag: NONREACTIVE

## 2022-11-21 ENCOUNTER — Telehealth: Payer: Self-pay

## 2022-11-21 LAB — CULTURE, BLOOD (ROUTINE X 2)
Culture: NO GROWTH
Culture: NO GROWTH
Special Requests: ADEQUATE

## 2022-11-21 NOTE — Transitions of Care (Post Inpatient/ED Visit) (Signed)
   11/21/2022  Name: Tammy Lawson MRN: [id number] DOB: [birth year]  Today's TOC FU Call Status: Today's TOC FU Call Status:: Unsuccessul Call (1st Attempt) Unsuccessful Call (1st Attempt) Date: 11/21/22  Attempted to reach the patient regarding the most recent Inpatient/ED visit.  Follow Up Plan: Additional outreach attempts will be made to reach the patient to complete the Transitions of Care (Post Inpatient/ED visit) call.   Jodelle Gross, RN, BSN, CCM Care Management Coordinator [facility]

## 2022-11-22 ENCOUNTER — Telehealth: Payer: Self-pay

## 2022-11-22 NOTE — Transitions of Care (Post Inpatient/ED Visit) (Signed)
11/22/2022  Name: Tammy Lawson MRN: [id number] DOB: 12/21/1955  Today's TOC FU Call Status: Today's TOC FU Call Status:: Successful TOC FU Call Competed TOC FU Call Complete Date: 11/22/22  Transition Care Management Follow-up Telephone Call Date of Discharge: 11/19/22 Discharge Facility: [facility]) Type of Discharge: Inpatient Admission Primary Inpatient Discharge Diagnosis:: Systemic Inflammatory Response Syndrome How have you been since you were released from the hospital?: Better (Patient notes her rash is better but she still has it) Any questions or concerns?: No  Items Reviewed: Did you receive and understand the discharge instructions provided?: Yes Medications obtained,verified, and reconciled?: Partial Review Completed Reason for Partial Mediation Review: Discussed new discharge medications Any new allergies since your discharge?: No Dietary orders reviewed?: Yes Type of Diet Ordered:: Low sodium, heart healthy Do you have support at home?: Yes People in Home: spouse Name of Support/Comfort Primary Source: Gelene Mink  Medications Reviewed Today: Medications Reviewed Today     Reviewed by Jodelle Gross, RN (Case Manager) on 11/22/22 at 1005  Med List Status: <None>   Medication Order Taking? Sig Documenting Provider Last Dose Status Informant  acetaminophen (TYLENOL) 500 MG tablet [id number] Yes Take 500 mg by mouth every 6 (six) hours as needed for moderate pain, mild pain, fever or headache. [provider] Taking Active Self  Ascorbic Acid (VITAMIN C) 100 MG tablet [id number] Yes Take 100 mg by mouth daily. [provider] Taking Active Self  CALCIUM-MAGNESIUM-ZINC PO [id number] Yes Take 1 capsule by mouth once a week. [provider] Taking Active Self  clobetasol cream (TEMOVATE) 0.05 % [id number]  Apply 1 Application topically 2 (two) times daily.  Patient taking differently: Apply 1 Application topically 3 (three) times a week.    Junie Spencer, FNP  Active Self  diphenhydrAMINE (BENADRYL) 50 MG tablet [id number] Yes Take 0.5 tablets (25 mg total) by mouth every 8 (eight) hours as needed for itching or allergies. Alwyn Ren, MD Taking Active   famotidine (PEPCID) 40 MG tablet [id number] Yes Take 1 tablet (40 mg total) by mouth daily. Alwyn Ren, MD Taking Active   fluticasone [place]) 50 MCG/ACT nasal spray [id number] Yes SHAKE LIQUID AND USE 2 SPRAYS IN EACH NOSTRIL DAILY Rakes, Doralee Albino, FNP Taking Active Self  hydrOXYzine (ATARAX) 10 MG tablet [id number] No Take 1 tablet (10 mg total) by mouth 3 (three) times daily.  Patient not taking: Reported on 11/22/2022   Alwyn Ren, MD Not Taking Active            Med Note Electa Sniff, [facility]   Fri Nov 22, 2022 10:04 AM) Patient is not taking as she is afraid it would make her sleepy.  Encouraged patient to try as it may help her rash symptoms  losartan (COZAAR) 50 MG tablet [id number] Yes Take 1 tablet (50 mg total) by mouth daily. Junie Spencer, FNP Taking Active Self  phenazopyridine (PYRIDIUM) 95 MG tablet [id number]  Take 95 mg by mouth 3 (three) times daily as needed for pain. [provider]  Active Self  predniSONE (DELTASONE) 20 MG tablet [id number] Yes Take 1 tablet (20 mg total) by mouth daily with breakfast. Alwyn Ren, MD Taking Active   rosuvastatin (CRESTOR) 10 MG tablet [id number]  TAKE 1 TABLET (10MG ) BY MOUTH DAILY Jannifer Rodney A, FNP  Active Self  senna-docusate (SENOKOT-S) 8.6-50 MG tablet [id number]  Take 1 tablet by mouth at bedtime as needed for mild constipation. Alwyn Ren, MD  Active  Vitamin D, Ergocalciferol, (DRISDOL) 1.25 MG (50000 UNIT) CAPS capsule [id number]  TAKE 1 CAPSULE BY MOUTH EVERY 7 DAYS Junie Spencer, FNP  Active Self           Med Note Ripley Fraise, Dereck Leep Nov 17, 2022  1:20 PM) Saturday             Home Care and Equipment/Supplies: Were Home Health Services Ordered?:  No Any new equipment or medical supplies ordered?: No  Functional Questionnaire: Do you need assistance with bathing/showering or dressing?: No Do you need assistance with meal preparation?: No Do you need assistance with eating?: No Do you have difficulty maintaining continence: No Do you need assistance with getting out of bed/getting out of a chair/moving?: No Do you have difficulty managing or taking your medications?: No  Follow up appointments reviewed: PCP Follow-up appointment confirmed?: Yes Date of PCP follow-up appointment?: 12/03/22 Follow-up Provider: Merry Proud, NP Specialist Hospital Follow-up appointment confirmed?: NA Do you need transportation to your follow-up appointment?: No Do you understand care options if your condition(s) worsen?: Yes-patient verbalized understanding  SDOH Interventions Today    Flowsheet Row Most Recent Value  SDOH Interventions   Food Insecurity Interventions Intervention Not Indicated  Housing Interventions Intervention Not Indicated  Transportation Interventions Intervention Not Indicated     Jodelle Gross, RN, BSN, CCM Care Management Coordinator [facility] Phone: [phone]/Fax: [phone]

## 2022-11-26 DIAGNOSIS — M9903 Segmental and somatic dysfunction of lumbar region: Secondary | ICD-10-CM | POA: Diagnosis not present

## 2022-11-26 DIAGNOSIS — M5432 Sciatica, left side: Secondary | ICD-10-CM | POA: Diagnosis not present

## 2022-12-03 ENCOUNTER — Ambulatory Visit (INDEPENDENT_AMBULATORY_CARE_PROVIDER_SITE_OTHER): Payer: PPO | Admitting: Family

## 2022-12-03 ENCOUNTER — Encounter: Payer: Self-pay | Admitting: Family

## 2022-12-03 VITALS — BP 128/74 | HR 98 | Temp 96.7°F | Ht 62.0 in | Wt 198.4 lb

## 2022-12-03 DIAGNOSIS — Z87440 Personal history of urinary (tract) infections: Secondary | ICD-10-CM

## 2022-12-03 DIAGNOSIS — R6510 Systemic inflammatory response syndrome (SIRS) of non-infectious origin without acute organ dysfunction: Secondary | ICD-10-CM

## 2022-12-03 DIAGNOSIS — R748 Abnormal levels of other serum enzymes: Secondary | ICD-10-CM | POA: Diagnosis not present

## 2022-12-03 DIAGNOSIS — T7840XD Allergy, unspecified, subsequent encounter: Secondary | ICD-10-CM

## 2022-12-03 DIAGNOSIS — R7303 Prediabetes: Secondary | ICD-10-CM

## 2022-12-03 DIAGNOSIS — Z09 Encounter for follow-up examination after completed treatment for conditions other than malignant neoplasm: Secondary | ICD-10-CM | POA: Diagnosis not present

## 2022-12-03 MED ORDER — FREESTYLE LIBRE 3 SENSOR MISC
1.0000 | 2 refills | Status: DC
Start: 2022-12-03 — End: 2023-02-18

## 2022-12-03 NOTE — Progress Notes (Addendum)
Subjective:    Patient ID: Tammy Lawson, female    DOB: Nov 15, 1955, 67 y.o.   MRN: [id number]  Chief Complaint  Patient presents with   Medical Management of Chronic Issues    Hospital follow-up    HPI Today's visit was for Transitional Care Management.  The patient was discharged from [facility] on 11/19/22 with a primary diagnosis of SIRS.   Contact with the patient and/or caregiver, by a clinical staff member, was made on 11/22/22 and was documented as a telephone encounter within the EMR.  Through chart review and discussion with the patient I have determined that management of their condition is of moderate complexity.   PT presents to the office today for hospital follow up. She went to the ED on 11/16/22 for fever and dysuria. She was taking Bactrim outpatient for UTI. She was given Rocephin but deveolped a macular rash on over body. Rocephin was stopped and started on benadryl, Pepcid, and steroids.   Her urine culture returned negative and thought she was having a allergic reaction.   Reports she is feeling much better.    Her liver enzymes were elevated and needs to be rechecked today. Her AST was 82 and ALT was 119. Had [place] of abdomen that showed, "1. Negative for gallstones. 2. Slightly echogenic liver parenchyma which may be due to steatosis."  She was discharged home on prednisone and has completed this. She was given Atarax 10 mg as needed for itching.   Pt is prediabetic and worried her glucose is elevated. Requesting libre system.   Review of Systems  All other systems reviewed and are negative.  Family History  Problem Relation Age of Onset   Diabetes Mother    Heart disease Mother    Stroke Mother    Hypertension Mother    Hyperlipidemia Mother    Arrhythmia Father    COPD Father    Rectal cancer Father    Colon polyps Father    Atrial fibrillation Father    Hyperlipidemia Father    Prostate cancer Father    Lung disease Father        tumor on  lung   Cancer Sister        Mets and don't know origin   Albinism Sister    Pulmonary fibrosis Sister    Other Sister        Hermansky pudlac syndrome   Diabetes Paternal Grandmother    Diabetes Son    Hypertension Son    Hyperlipidemia Son    Esophageal cancer Neg Hx    Stomach cancer Neg Hx    Breast cancer Neg Hx    Social History   Socioeconomic History   Marital status: Married    Spouse name: Justin Mend   Number of children: 1   Years of education: 12   Highest education level: 12th grade  Occupational History   Occupation: retired   Occupation: Retired Engineer, site  Tobacco Use   Smoking status: Never   Smokeless tobacco: Never  Vaping Use   Vaping status: Never Used  Substance and Sexual Activity   Alcohol use: No   Drug use: No   Sexual activity: Yes  Other Topics Concern   Not on file  Social History Narrative   Son lives 25 miles away   Lives with her husband in one level home   Social Determinants of Health   Financial Resource Strain: Low Risk  (09/13/2022)   Overall Financial Resource Strain (CARDIA)  Difficulty of Paying Living Expenses: Not hard at all  Food Insecurity: No Food Insecurity (11/22/2022)   Hunger Vital Sign    Worried About Running Out of Food in the Last Year: Never true    Ran Out of Food in the Last Year: Never true  Transportation Needs: No Transportation Needs (11/22/2022)   PRAPARE - Administrator, Civil Service (Medical): No    Lack of Transportation (Non-Medical): No  Physical Activity: Sufficiently Active (09/13/2022)   Exercise Vital Sign    Days of Exercise per Week: 6 days    Minutes of Exercise per Session: 40 min  Stress: No Stress Concern Present (09/13/2022)   [employer] of Occupational Health - Occupational Stress Questionnaire    Feeling of Stress : Not at all  Social Connections: Moderately Integrated (09/13/2022)   Social Connection and Isolation Panel [NHANES]    Frequency of Communication  with Friends and Family: More than three times a week    Frequency of Social Gatherings with Friends and Family: More than three times a week    Attends Religious Services: More than 4 times per year    Active Member of [employer] or Organizations: No    Attends Banker Meetings: Never    Marital Status: Married        Objective:   Physical Exam Vitals reviewed.  Constitutional:      General: She is not in acute distress.    Appearance: She is well-developed.  HENT:     Head: Normocephalic and atraumatic.  Eyes:     Pupils: Pupils are equal, round, and reactive to light.  Neck:     Thyroid: No thyromegaly.  Cardiovascular:     Rate and Rhythm: Normal rate and regular rhythm.     Heart sounds: Normal heart sounds. No murmur heard. Pulmonary:     Effort: Pulmonary effort is normal. No respiratory distress.     Breath sounds: Normal breath sounds. No wheezing.  Abdominal:     General: Bowel sounds are normal. There is no distension.     Palpations: Abdomen is soft.     Tenderness: There is no abdominal tenderness.  Musculoskeletal:        General: No tenderness. Normal range of motion.     Cervical back: Normal range of motion and neck supple.  Skin:    General: Skin is warm and dry.  Neurological:     Mental Status: She is alert and oriented to person, place, and time.     Cranial Nerves: No cranial nerve deficit.     Deep Tendon Reflexes: Reflexes are normal and symmetric.  Psychiatric:        Behavior: Behavior normal.        Thought Content: Thought content normal.        Judgment: Judgment normal.     BP 128/74   Pulse 98   Temp (!) 96.7 F (35.9 C)   Ht 5\' 2"  (1.575 m)   Wt 198 lb 6.4 oz (90 kg)   SpO2 100%   BMI 36.29 kg/m        Assessment & Plan:  Tammy Lawson comes in today with chief complaint of Medical Management of Chronic Issues [facility] follow-up)   Diagnosis and orders addressed:  1. History of UTI - CBC with  Differential/Platelet - CMP14+EGFR  2. Hospital discharge follow-up - CBC with Differential/Platelet - CMP14+EGFR  3. Allergic reaction, subsequent encounter - CBC with Differential/Platelet - CMP14+EGFR  4.  Elevated liver enzymes - CBC with Differential/Platelet - CMP14+EGFR  5. Prediabetes - Continuous Glucose Sensor (FREESTYLE LIBRE 3 SENSOR) MISC; 1 Application by Does not apply route every 14 (fourteen) days.  Dispense: 2 each; Refill: 2   Labs pending Libre Prescription sent to pharmacy, unsure if insurance will cover this. Unlikely since prediabetic.  Low carb diet  Health Maintenance reviewed Diet and exercise encouraged  Follow up plan: Keep chronic follow up  Jannifer Rodney, FNP

## 2022-12-03 NOTE — Patient Instructions (Signed)
Elevated Liver Enzymes  When the liver gets hurt, it lets out substances called enzymes into the blood. This makes the enzyme levels in your blood go up. These are elevated liver enzymes. A blood test can detect this. The most common enzymes released are alanine transaminase (ALT) and aspartate transaminase (AST). These two enzymes are also called transaminases. What causes elevated liver enzymes? Elevated liver enzymes can be caused by: Liver problems, like hepatitis B or C. Major muscle injury. Regular or heavy alcohol use. Certain medicines and supplements. If liver enzymes are a little higher than normal people often do not have symptoms. Your health care provider may do more blood tests or imaging tests to help find the cause of your elevated liver enzymes. Follow these instructions at home: Medicines Ask about changing or stopping: Any medicines you take. Any vitamins, herbs, or supplements you take. Do not take aspirin or ibuprofen unless you're told to. Alcohol use Do not drink alcohol if: Your provider tells you not to drink. You're pregnant, may be pregnant, or plan to become pregnant. If you drink alcohol: Limit how much you have to: 0-1 drink a day if you're female. 0-2 drinks a day if you're female. Know how much alcohol is in your drink. In the [place] one drink is one 12 oz bottle of beer (355 mL), one 5 oz glass of wine (148 mL), or one 1 oz glass of hard liquor (44 mL). Lifestyle  To help your liver heal, your provider may tell you to: Eat a healthy diet. Exercise. Lose weight. Limit foods and drinks that have a lot of sugar. General instructions Drink more fluids as told. Ask what things are safe for you to do at home. Ask when you can go back to work or school. Keep all follow-up visits. The provider will check if your liver enzymes are going down. Contact a health care provider if: You have pain or swelling in your belly. Your skin or eyes turn yellow. You  lose weight without trying. You throw up or feel like you may throw up. This information is not intended to replace advice given to you by your health care provider. Make sure you discuss any questions you have with your health care provider. Document Revised: 08/26/2022 Document Reviewed: 08/26/2022 Elsevier Patient Education  2024 [employer].

## 2022-12-24 DIAGNOSIS — M9903 Segmental and somatic dysfunction of lumbar region: Secondary | ICD-10-CM | POA: Diagnosis not present

## 2022-12-24 DIAGNOSIS — M5432 Sciatica, left side: Secondary | ICD-10-CM | POA: Diagnosis not present

## 2023-01-06 ENCOUNTER — Other Ambulatory Visit: Payer: Self-pay | Admitting: Family

## 2023-01-06 DIAGNOSIS — E7800 Pure hypercholesterolemia, unspecified: Secondary | ICD-10-CM

## 2023-01-17 ENCOUNTER — Other Ambulatory Visit: Payer: Self-pay | Admitting: Family

## 2023-01-17 DIAGNOSIS — E559 Vitamin D deficiency, unspecified: Secondary | ICD-10-CM

## 2023-01-21 DIAGNOSIS — H2513 Age-related nuclear cataract, bilateral: Secondary | ICD-10-CM | POA: Diagnosis not present

## 2023-01-22 DIAGNOSIS — M9903 Segmental and somatic dysfunction of lumbar region: Secondary | ICD-10-CM | POA: Diagnosis not present

## 2023-01-22 DIAGNOSIS — M5432 Sciatica, left side: Secondary | ICD-10-CM | POA: Diagnosis not present

## 2023-02-12 DIAGNOSIS — M5432 Sciatica, left side: Secondary | ICD-10-CM | POA: Diagnosis not present

## 2023-02-12 DIAGNOSIS — M9903 Segmental and somatic dysfunction of lumbar region: Secondary | ICD-10-CM | POA: Diagnosis not present

## 2023-02-14 ENCOUNTER — Ambulatory Visit: Payer: PPO | Admitting: Family

## 2023-02-14 VITALS — BP 137/83 | HR 82 | Temp 97.6°F | Ht 62.0 in | Wt 196.2 lb

## 2023-02-14 DIAGNOSIS — N3000 Acute cystitis without hematuria: Secondary | ICD-10-CM

## 2023-02-14 DIAGNOSIS — R300 Dysuria: Secondary | ICD-10-CM | POA: Diagnosis not present

## 2023-02-14 DIAGNOSIS — I10 Essential (primary) hypertension: Secondary | ICD-10-CM | POA: Diagnosis not present

## 2023-02-14 DIAGNOSIS — I83813 Varicose veins of bilateral lower extremities with pain: Secondary | ICD-10-CM | POA: Diagnosis not present

## 2023-02-14 DIAGNOSIS — E559 Vitamin D deficiency, unspecified: Secondary | ICD-10-CM | POA: Diagnosis not present

## 2023-02-14 DIAGNOSIS — R7303 Prediabetes: Secondary | ICD-10-CM

## 2023-02-14 DIAGNOSIS — I739 Peripheral vascular disease, unspecified: Secondary | ICD-10-CM | POA: Diagnosis not present

## 2023-02-14 DIAGNOSIS — E7800 Pure hypercholesterolemia, unspecified: Secondary | ICD-10-CM

## 2023-02-14 LAB — MICROSCOPIC EXAMINATION: Renal Epithel, UA: NONE SEEN /[HPF]

## 2023-02-14 LAB — URINALYSIS, ROUTINE W REFLEX MICROSCOPIC
Bilirubin, UA: NEGATIVE
Glucose, UA: NEGATIVE
Ketones, UA: NEGATIVE
Nitrite, UA: NEGATIVE
Protein,UA: NEGATIVE
Specific Gravity, UA: 1.025 (ref 1.005–1.030)
Urobilinogen, Ur: 0.2 mg/dL (ref 0.2–1.0)
pH, UA: 5.5 (ref 5.0–7.5)

## 2023-02-14 MED ORDER — NITROFURANTOIN MONOHYD MACRO 100 MG PO CAPS
100.0000 mg | ORAL_CAPSULE | Freq: Two times a day (BID) | ORAL | 0 refills | Status: DC
Start: 2023-02-14 — End: 2023-08-15

## 2023-02-14 NOTE — Patient Instructions (Signed)
Peripheral Vascular Disease  Peripheral vascular disease (PVD) is a disease of the blood vessels. PVD may also be called peripheral artery disease (PAD) or poor circulation. PVD is the blocking or hardening of the arteries anywhere within the circulatory system beyond the heart. This can result in a decreased supply of blood to the arms, legs, and internal organs, such as the stomach or kidneys. However, PVD most often affects a person's lower legs and feet. Without treatment, PVD often worsens. PVD can lead to acute limb ischemia. This occurs when an arm or leg suddenly has trouble getting enough blood. This is a medical emergency. What are the causes? The most common cause of PVD is atherosclerosis. This is a buildup of fatty material and other substances (plaque)inside your arteries. Pieces of plaque can break off from the walls of an artery and become stuck in a smaller artery, blocking blood flow and possibly causing acute limb ischemia. Other common causes of PVD include: Blood clots that form inside the blood vessels. Injuries to blood vessels. Diseases that cause inflammation of blood vessels or cause blood vessel tightening (spasms). What increases the risk? The following factors may make you more likely to develop this condition: A family history of PVD. Common medical conditions, including: High cholesterol. Diabetes. High blood pressure (hypertension). Heart disease. Known atherosclerotic disease in another area of the body. Past injury, such as burns or a broken bone. Other medical conditions, such as: Buerger's disease. This is caused by inflamed blood vessels in your hands and feet. Some forms of arthritis. Birth defects that affect the arteries in your legs. Kidney disease. Using tobacco and nicotine products. Not getting enough exercise. Obesity. Being age 71 or older, or being age 81 or older and having the other risk factors. What are the signs or symptoms? This  condition may cause different symptoms. Your symptoms depend on what body part is not getting enough blood. Common signs and symptoms include: Cramps in your buttocks, legs, and feet. Intermittent claudication. This is pain and weakness in your legs during activity that resolves with rest. Leg pain at rest and leg numbness, tingling, or weakness. Coldness in a leg or foot, especially when compared to the other leg or foot. Skin or hair changes. These can include: Hair loss. Shiny skin. Pale or bluish skin. Thick toenails. Inability to get or maintain an erection (erectile dysfunction). Tiredness (fatigue). Weak pulse or no pulse in the feet. People with PVD are more likely to develop open wounds (ulcers) and sores on their toes, feet, or legs. The ulcers or sores may take longer than normal to heal. How is this diagnosed? PVD is diagnosed based on your signs and symptoms, a physical exam, and your medical history. You may also have other tests to find the cause. Tests include: Ankle-brachial index test.This test compares the blood pressure readings of the legs and arms. This may also include an exercise ankle-brachial index test in which you walk on a treadmill to check your symptoms. Doppler ultrasound. This takes pictures of blood flow through your blood vessels. Imaging studies that use dye to show blood flow. These are: CT angiogram. Magnetic resonance angiogram, or MRA. How is this treated? Treatment for PVD depends on the cause of your condition, how severe your symptoms are, and your age. Underlying causes need to be treated and controlled. These include long-term (chronic) conditions, such as diabetes, high cholesterol, and hypertension. Treatment may include: Lifestyle changes, such as: Quitting tobacco use. Exercising regularly. Following a  low-fat, low-cholesterol diet. Not drinking alcohol. Taking medicines, such as: Blood thinners to prevent blood clots. Medicines to  improve blood flow. Medicines to improve cholesterol levels. Procedures, such as: Angioplasty. This uses an inflated balloon to open a blocked artery and improve blood flow. Stent implant. This inserts a small mesh tube to keep a blocked artery open. Peripheral bypass surgery. This reroutes blood flow around a blocked artery. Surgery to remove dead tissue from an infected wound (debridement). Amputation. This is surgical removal of the affected limb. It may be necessary in cases of acute limb ischemia when medical or surgical treatments have not helped. Follow these instructions at home: Medicines Take over-the-counter and prescription medicines only as told by your health care provider. If you are taking blood thinners: Talk with your health care provider before you take any medicines that contain aspirin or NSAIDs, such as ibuprofen. These medicines increase your risk for dangerous bleeding. Take your medicine exactly as told, at the same time every day. Avoid activities that could cause injury or bruising, and follow instructions about how to prevent falls. Wear a medical alert bracelet or carry a card that lists what medicines you take. Lifestyle     Exercise regularly. Ask your health care provider about some good activities for you. Talk with your health care provider about maintaining a healthy weight. If needed, ask about losing weight. Eat a diet that is low in fat and cholesterol. If you need help, talk with your health care provider. Do not drink alcohol. Do not use any products that contain nicotine or tobacco. These products include cigarettes, chewing tobacco, and vaping devices, such as e-cigarettes. If you need help quitting, ask your health care provider. General instructions Take good care of your feet. To do this: Wear comfortable shoes that fit well. Check your feet often for any cuts or sores. Get an annual influenza vaccine. Keep all follow-up visits. This is  important. Where to find more information Society for Vascular Surgery: vascular.org American Heart Association: heart.org National Heart, Lung, and Blood Institute: [employer].gl Contact a health care provider if: You have leg cramps while walking. You have leg pain when you rest. Your leg or foot feels cold. Your skin changes color. You have erectile dysfunction. You have cuts or sores on your legs or feet that do not heal. Get help right away if: You have sudden changes in color and feeling of your arms or legs, such as: Your arm or leg turns cold, numb, and blue. Your arm or leg becomes red, warm, swollen, painful, or numb. You have any symptoms of a stroke. "BE FAST" is an easy way to remember the main warning signs of a stroke: B - Balance. Signs are dizziness, sudden trouble walking, or loss of balance. E - Eyes. Signs are trouble seeing or a sudden change in vision. F - Face. Signs are sudden weakness or numbness of the face, or the face or eyelid drooping on one side. A - Arms. Signs are weakness or numbness in an arm. This happens suddenly and usually on one side of the body. S - Speech. Signs are sudden trouble speaking, slurred speech, or trouble understanding what people say. T - Time. Time to call emergency services. Write down what time symptoms started. You have other signs of a stroke, such as: A sudden, severe headache with no known cause. Nausea or vomiting. Seizure. You have chest pain or trouble breathing. These symptoms may represent a serious problem that is an emergency.  Do not wait to see if the symptoms will go away. Get medical help right away. Call your local emergency services (911 in the [place] Do not drive yourself to the hospital. Summary Peripheral vascular disease (PVD) is a disease of the blood vessels. PVD is the blocking or hardening of the arteries anywhere within the circulatory system beyond the heart. PVD may cause different symptoms. Your  symptoms depend on what part of your body is not getting enough blood. Treatment for PVD depends on what caused it, how severe your symptoms are, and your age. This information is not intended to replace advice given to you by your health care provider. Make sure you discuss any questions you have with your health care provider. Document Revised: 09/28/2019 Document Reviewed: 10/25/2019 Elsevier Patient Education  2024 [employer].

## 2023-02-14 NOTE — Progress Notes (Signed)
Subjective:    Patient ID: Tammy Lawson, female    DOB: 08-15-1955, 67 y.o.   MRN: [id number]  Chief Complaint  Patient presents with   Medical Management of Chronic Issues    6 month    Joint Swelling    Right ankle swelling since August. Painful and red    Dysuria    Burning with urination and urgency    PT presents to the office today for chronic follow up.  She is considered morbid obese with a BMI of 35 and co morbility of HTN.     Complaining of increased redness and breakouts on face that comes and goes.  Dysuria  This is a new problem. The current episode started 1 to 4 weeks ago. The problem occurs every urination. The problem has been gradually worsening. The quality of the pain is described as burning. The pain is at a severity of 6/10. The pain is mild. Associated symptoms include frequency, hesitancy, nausea, urgency and vomiting. Pertinent negatives include no flank pain. She has tried increased fluids (Azo) for the symptoms. The treatment provided mild relief.  Hypertension This is a chronic problem. The current episode started more than 1 year ago. The problem has been resolved since onset. Associated symptoms include peripheral edema (right that comes and goes). Pertinent negatives include no malaise/fatigue or shortness of breath. Risk factors for coronary artery disease include dyslipidemia, obesity and sedentary lifestyle. The current treatment provides moderate improvement.  Hyperlipidemia This is a chronic problem. The current episode started more than 1 year ago. The problem is controlled. Recent lipid tests were reviewed and are normal. Exacerbating diseases include obesity. Pertinent negatives include no shortness of breath. Current antihyperlipidemic treatment includes statins. The current treatment provides moderate improvement of lipids. Risk factors for coronary artery disease include dyslipidemia, hypertension, a sedentary lifestyle and post-menopausal.   Diabetes She presents for her follow-up diabetic visit. Diabetes type: prediabetic. Pertinent negatives for diabetes include no foot paresthesias and no foot ulcerations. Risk factors for coronary artery disease include dyslipidemia, diabetes mellitus, hypertension, sedentary lifestyle and post-menopausal. She is following a generally healthy diet. Her overall blood glucose range is 110-130 mg/dl.      Review of Systems  Constitutional:  Negative for malaise/fatigue.  Respiratory:  Negative for shortness of breath.   Gastrointestinal:  Positive for nausea and vomiting.  Genitourinary:  Positive for dysuria, frequency, hesitancy and urgency. Negative for flank pain.  All other systems reviewed and are negative.      Objective:   Physical Exam Vitals reviewed.  Constitutional:      General: She is not in acute distress.    Appearance: She is well-developed. She is obese.  HENT:     Head: Normocephalic and atraumatic.     Right Ear: Tympanic membrane normal.     Left Ear: Tympanic membrane normal.  Eyes:     Pupils: Pupils are equal, round, and reactive to light.  Neck:     Thyroid: No thyromegaly.  Cardiovascular:     Rate and Rhythm: Normal rate and regular rhythm.     Heart sounds: Normal heart sounds. No murmur heard. Pulmonary:     Effort: Pulmonary effort is normal. No respiratory distress.     Breath sounds: Normal breath sounds. No wheezing.  Abdominal:     General: Bowel sounds are normal. There is no distension.     Palpations: Abdomen is soft.     Tenderness: There is no abdominal tenderness.  Musculoskeletal:  General: No tenderness. Normal range of motion.     Cervical back: Normal range of motion and neck supple.  Skin:    General: Skin is warm and dry.     Comments: Varicose veins in right leg, mild erythemas in lower right leg.   Neurological:     Mental Status: She is alert and oriented to person, place, and time.     Cranial Nerves: No cranial  nerve deficit.     Deep Tendon Reflexes: Reflexes are normal and symmetric.  Psychiatric:        Behavior: Behavior normal.        Thought Content: Thought content normal.        Judgment: Judgment normal.       BP 137/83   Pulse 82   Temp 97.6 F (36.4 C) (Temporal)   Ht 5\' 2"  (1.575 m)   Wt 196 lb 3.2 oz (89 kg)   SpO2 99%   BMI 35.89 kg/m      Assessment & Plan:  [name] comes in today with chief complaint of Medical Management of Chronic Issues (6 month ), Joint Swelling (Right ankle swelling since August. Painful and red ), and Dysuria (Burning with urination and urgency )   Diagnosis and orders addressed:  1. Dysuria - Urinalysis, Routine w reflex microscopic - CMP14+EGFR - CBC with Differential/Platelet  2. Essential hypertension - CMP14+EGFR - CBC with Differential/Platelet  3. Morbid obesity (HCC) - CMP14+EGFR - CBC with Differential/Platelet  4. Vitamin D deficiency - CMP14+EGFR - CBC with Differential/Platelet  5. Pure hypercholesterolemia - CMP14+EGFR - CBC with Differential/Platelet  6. Prediabetes - CMP14+EGFR - CBC with Differential/Platelet  7. Varicose veins of both lower extremities with pain - CMP14+EGFR - CBC with Differential/Platelet  8. PAD (peripheral artery disease) (HCC) - CMP14+EGFR - CBC with Differential/Platelet  9. Acute cystitis without hematuria Start Macrobid Force fluids AZO over the counter X2 days RTO prn Culture pending - nitrofurantoin, macrocrystal-monohydrate, (MACROBID) 100 MG capsule; Take 1 capsule (100 mg total) by mouth 2 (two) times daily. 1 po BId  Dispense: 14 capsule; Refill: 0 - CMP14+EGFR - CBC with Differential/Platelet   Labs pending Continue current medications  If leg pain continues may need vascular referral. Compression hose.  Health Maintenance reviewed Diet and exercise encouraged  Follow up plan: 6 months    Jannifer Rodney, FNP

## 2023-02-15 LAB — CMP14+EGFR
ALT: 16 [IU]/L (ref 0–32)
AST: 20 [IU]/L (ref 0–40)
Albumin: 4.6 g/dL (ref 3.9–4.9)
Alkaline Phosphatase: 68 [IU]/L (ref 44–121)
BUN/Creatinine Ratio: 23 (ref 12–28)
BUN: 19 mg/dL (ref 8–27)
Bilirubin Total: 0.5 mg/dL (ref 0.0–1.2)
CO2: 22 mmol/L (ref 20–29)
Calcium: 9.8 mg/dL (ref 8.7–10.3)
Chloride: 105 mmol/L (ref 96–106)
Creatinine, Ser: 0.84 mg/dL (ref 0.57–1.00)
Globulin, Total: 2.5 g/dL (ref 1.5–4.5)
Glucose: 104 mg/dL — ABNORMAL HIGH (ref 70–99)
Potassium: 4.9 mmol/L (ref 3.5–5.2)
Sodium: 143 mmol/L (ref 134–144)
Total Protein: 7.1 g/dL (ref 6.0–8.5)
eGFR: 76 mL/min/{1.73_m2} (ref >59)

## 2023-02-15 LAB — CBC WITH DIFFERENTIAL/PLATELET
Basophils Absolute: 0 10*3/uL (ref 0.0–0.2)
Basos: 1 %
EOS (ABSOLUTE): 0.2 10*3/uL (ref 0.0–0.4)
Eos: 3 %
Hematocrit: 43.9 % (ref 34.0–46.6)
Hemoglobin: 13.6 g/dL (ref 11.1–15.9)
Immature Grans (Abs): 0 10*3/uL (ref 0.0–0.1)
Immature Granulocytes: 0 %
Lymphocytes Absolute: 1.6 10*3/uL (ref 0.7–3.1)
Lymphs: 26 %
MCH: 29.1 pg (ref 26.6–33.0)
MCHC: 31 g/dL — ABNORMAL LOW (ref 31.5–35.7)
MCV: 94 fL (ref 79–97)
Monocytes Absolute: 0.4 10*3/uL (ref 0.1–0.9)
Monocytes: 6 %
Neutrophils Absolute: 4.1 10*3/uL (ref 1.4–7.0)
Neutrophils: 64 %
Platelets: 237 10*3/uL (ref 150–450)
RBC: 4.68 x10E6/uL (ref 3.77–5.28)
RDW: 13.7 % (ref 11.7–15.4)
WBC: 6.3 10*3/uL (ref 3.4–10.8)

## 2023-02-18 ENCOUNTER — Other Ambulatory Visit: Payer: Self-pay | Admitting: Family

## 2023-02-18 DIAGNOSIS — R7303 Prediabetes: Secondary | ICD-10-CM

## 2023-03-05 DIAGNOSIS — M5432 Sciatica, left side: Secondary | ICD-10-CM | POA: Diagnosis not present

## 2023-03-05 DIAGNOSIS — M9903 Segmental and somatic dysfunction of lumbar region: Secondary | ICD-10-CM | POA: Diagnosis not present

## 2023-03-26 DIAGNOSIS — M9903 Segmental and somatic dysfunction of lumbar region: Secondary | ICD-10-CM | POA: Diagnosis not present

## 2023-03-26 DIAGNOSIS — M5432 Sciatica, left side: Secondary | ICD-10-CM | POA: Diagnosis not present

## 2023-04-16 DIAGNOSIS — M5432 Sciatica, left side: Secondary | ICD-10-CM | POA: Diagnosis not present

## 2023-04-16 DIAGNOSIS — M9903 Segmental and somatic dysfunction of lumbar region: Secondary | ICD-10-CM | POA: Diagnosis not present

## 2023-04-22 DIAGNOSIS — H25013 Cortical age-related cataract, bilateral: Secondary | ICD-10-CM | POA: Diagnosis not present

## 2023-04-22 DIAGNOSIS — H2513 Age-related nuclear cataract, bilateral: Secondary | ICD-10-CM | POA: Diagnosis not present

## 2023-04-22 DIAGNOSIS — H2511 Age-related nuclear cataract, right eye: Secondary | ICD-10-CM | POA: Diagnosis not present

## 2023-04-22 DIAGNOSIS — H18413 Arcus senilis, bilateral: Secondary | ICD-10-CM | POA: Diagnosis not present

## 2023-04-22 DIAGNOSIS — H25043 Posterior subcapsular polar age-related cataract, bilateral: Secondary | ICD-10-CM | POA: Diagnosis not present

## 2023-04-23 LAB — LAB REPORT - SCANNED
A1c: 6
EGFR: 95

## 2023-05-04 ENCOUNTER — Other Ambulatory Visit: Payer: Self-pay | Admitting: Family

## 2023-05-04 DIAGNOSIS — I10 Essential (primary) hypertension: Secondary | ICD-10-CM

## 2023-05-05 DIAGNOSIS — M9903 Segmental and somatic dysfunction of lumbar region: Secondary | ICD-10-CM | POA: Diagnosis not present

## 2023-05-05 DIAGNOSIS — M5432 Sciatica, left side: Secondary | ICD-10-CM | POA: Diagnosis not present

## 2023-05-26 DIAGNOSIS — M5432 Sciatica, left side: Secondary | ICD-10-CM | POA: Diagnosis not present

## 2023-05-26 DIAGNOSIS — M9903 Segmental and somatic dysfunction of lumbar region: Secondary | ICD-10-CM | POA: Diagnosis not present

## 2023-06-25 ENCOUNTER — Other Ambulatory Visit: Payer: Self-pay | Admitting: Family

## 2023-06-25 DIAGNOSIS — M9903 Segmental and somatic dysfunction of lumbar region: Secondary | ICD-10-CM | POA: Diagnosis not present

## 2023-06-25 DIAGNOSIS — M5432 Sciatica, left side: Secondary | ICD-10-CM | POA: Diagnosis not present

## 2023-06-25 DIAGNOSIS — E7800 Pure hypercholesterolemia, unspecified: Secondary | ICD-10-CM

## 2023-07-03 ENCOUNTER — Other Ambulatory Visit: Payer: Self-pay | Admitting: Family

## 2023-07-03 DIAGNOSIS — E559 Vitamin D deficiency, unspecified: Secondary | ICD-10-CM

## 2023-07-16 DIAGNOSIS — M9903 Segmental and somatic dysfunction of lumbar region: Secondary | ICD-10-CM | POA: Diagnosis not present

## 2023-07-16 DIAGNOSIS — M5432 Sciatica, left side: Secondary | ICD-10-CM | POA: Diagnosis not present

## 2023-07-24 ENCOUNTER — Telehealth: Payer: Self-pay

## 2023-07-24 DIAGNOSIS — R7303 Prediabetes: Secondary | ICD-10-CM

## 2023-07-24 NOTE — Telephone Encounter (Signed)
 Copied from CRM [id number]. Topic: Clinical - Medication Question >> Jul 24, 2023  1:34 PM Gery Pray wrote: Reason for CRM: Company is discontinuing the Continuous Glucose Sensor (FREESTYLE LIBRE 3 SENSOR) MISC and will be moving to the 3 plus version. They are currently out of stock of the Continuous Glucose Sensor (FREESTYLE LIBRE 3 SENSOR) MISC and do not know when they will get them in. Patient would like to know if FNP Jannifer Rodney could write a prescription of the Glucose Sensor (FREESTYLE LIBRE 3 PLUS SENSOR).

## 2023-07-25 MED ORDER — FREESTYLE LIBRE 3 PLUS SENSOR MISC: 3 refills | Status: AC

## 2023-07-25 NOTE — Telephone Encounter (Signed)
 Prescription sent to pharmacy.

## 2023-07-25 NOTE — Addendum Note (Signed)
 Addended by: Jannifer Rodney A on: 07/25/2023 04:14 PM   Modules accepted: Orders

## 2023-08-06 DIAGNOSIS — M5432 Sciatica, left side: Secondary | ICD-10-CM | POA: Diagnosis not present

## 2023-08-06 DIAGNOSIS — M9903 Segmental and somatic dysfunction of lumbar region: Secondary | ICD-10-CM | POA: Diagnosis not present

## 2023-08-11 DIAGNOSIS — H2511 Age-related nuclear cataract, right eye: Secondary | ICD-10-CM | POA: Diagnosis not present

## 2023-08-12 DIAGNOSIS — H25012 Cortical age-related cataract, left eye: Secondary | ICD-10-CM | POA: Diagnosis not present

## 2023-08-12 DIAGNOSIS — H2512 Age-related nuclear cataract, left eye: Secondary | ICD-10-CM | POA: Diagnosis not present

## 2023-08-12 DIAGNOSIS — H25042 Posterior subcapsular polar age-related cataract, left eye: Secondary | ICD-10-CM | POA: Diagnosis not present

## 2023-08-15 ENCOUNTER — Encounter: Payer: Self-pay | Admitting: Family

## 2023-08-15 ENCOUNTER — Ambulatory Visit: Payer: PPO | Admitting: Family

## 2023-08-15 VITALS — BP 133/79 | HR 90 | Temp 98.0°F | Ht 62.0 in | Wt 193.0 lb

## 2023-08-15 DIAGNOSIS — Z0000 Encounter for general adult medical examination without abnormal findings: Secondary | ICD-10-CM

## 2023-08-15 DIAGNOSIS — M8589 Other specified disorders of bone density and structure, multiple sites: Secondary | ICD-10-CM | POA: Diagnosis not present

## 2023-08-15 DIAGNOSIS — E7800 Pure hypercholesterolemia, unspecified: Secondary | ICD-10-CM

## 2023-08-15 DIAGNOSIS — Z6835 Body mass index (BMI) 35.0-35.9, adult: Secondary | ICD-10-CM | POA: Diagnosis not present

## 2023-08-15 DIAGNOSIS — E559 Vitamin D deficiency, unspecified: Secondary | ICD-10-CM

## 2023-08-15 DIAGNOSIS — Z23 Encounter for immunization: Secondary | ICD-10-CM

## 2023-08-15 DIAGNOSIS — R7303 Prediabetes: Secondary | ICD-10-CM

## 2023-08-15 DIAGNOSIS — Z0001 Encounter for general adult medical examination with abnormal findings: Secondary | ICD-10-CM

## 2023-08-15 DIAGNOSIS — I10 Essential (primary) hypertension: Secondary | ICD-10-CM

## 2023-08-15 LAB — BAYER DCA HB A1C WAIVED: HB A1C (BAYER DCA - WAIVED): 5.8 % — ABNORMAL HIGH (ref 4.8–5.6)

## 2023-08-15 MED ORDER — LOSARTAN POTASSIUM 50 MG PO TABS: 50.0000 mg | ORAL_TABLET | Freq: Every day | ORAL | 1 refills | Status: DC

## 2023-08-15 MED ORDER — ROSUVASTATIN CALCIUM 10 MG PO TABS: 10.0000 mg | ORAL_TABLET | Freq: Every day | ORAL | 1 refills | Status: DC

## 2023-08-15 NOTE — Patient Instructions (Signed)
 Health Maintenance After Age 69 After age 4, you are at a higher risk for certain long-term diseases and infections as well as injuries from falls. Falls are a major cause of broken bones and head injuries in people who are older than age 47. Getting regular preventive care can help to keep you healthy and well. Preventive care includes getting regular testing and making lifestyle changes as recommended by your health care provider. Talk with your health care provider about: Which screenings and tests you should have. A screening is a test that checks for a disease when you have no symptoms. A diet and exercise plan that is right for you. What should I know about screenings and tests to prevent falls? Screening and testing are the best ways to find a health problem early. Early diagnosis and treatment give you the best chance of managing medical conditions that are common after age 37. Certain conditions and lifestyle choices may make you more likely to have a fall. Your health care provider may recommend: Regular vision checks. Poor vision and conditions such as cataracts can make you more likely to have a fall. If you wear glasses, make sure to get your prescription updated if your vision changes. Medicine review. Work with your health care provider to regularly review all of the medicines you are taking, including over-the-counter medicines. Ask your health care provider about any side effects that may make you more likely to have a fall. Tell your health care provider if any medicines that you take make you feel dizzy or sleepy. Strength and balance checks. Your health care provider may recommend certain tests to check your strength and balance while standing, walking, or changing positions. Foot health exam. Foot pain and numbness, as well as not wearing proper footwear, can make you more likely to have a fall. Screenings, including: Osteoporosis screening. Osteoporosis is a condition that causes  the bones to get weaker and break more easily. Blood pressure screening. Blood pressure changes and medicines to control blood pressure can make you feel dizzy. Depression screening. You may be more likely to have a fall if you have a fear of falling, feel depressed, or feel unable to do activities that you used to do. Alcohol use screening. Using too much alcohol can affect your balance and may make you more likely to have a fall. Follow these instructions at home: Lifestyle Do not drink alcohol if: Your health care provider tells you not to drink. If you drink alcohol: Limit how much you have to: 0-1 drink a day for women. 0-2 drinks a day for men. Know how much alcohol is in your drink. In the [place] one drink equals one 12 oz bottle of beer (355 mL), one 5 oz glass of wine (148 mL), or one 1 oz glass of hard liquor (44 mL). Do not use any products that contain nicotine or tobacco. These products include cigarettes, chewing tobacco, and vaping devices, such as e-cigarettes. If you need help quitting, ask your health care provider. Activity  Follow a regular exercise program to stay fit. This will help you maintain your balance. Ask your health care provider what types of exercise are appropriate for you. If you need a cane or walker, use it as recommended by your health care provider. Wear supportive shoes that have nonskid soles. Safety  Remove any tripping hazards, such as rugs, cords, and clutter. Install safety equipment such as grab bars in bathrooms and safety rails on stairs. Keep rooms and walkways  well-lit. General instructions Talk with your health care provider about your risks for falling. Tell your health care provider if: You fall. Be sure to tell your health care provider about all falls, even ones that seem minor. You feel dizzy, tiredness (fatigue), or off-balance. Take over-the-counter and prescription medicines only as told by your health care provider. These include  supplements. Eat a healthy diet and maintain a healthy weight. A healthy diet includes low-fat dairy products, low-fat (lean) meats, and fiber from whole grains, beans, and lots of fruits and vegetables. Stay current with your vaccines. Schedule regular health, dental, and eye exams. Summary Having a healthy lifestyle and getting preventive care can help to protect your health and wellness after age 11. Screening and testing are the best way to find a health problem early and help you avoid having a fall. Early diagnosis and treatment give you the best chance for managing medical conditions that are more common for people who are older than age 28. Falls are a major cause of broken bones and head injuries in people who are older than age 48. Take precautions to prevent a fall at home. Work with your health care provider to learn what changes you can make to improve your health and wellness and to prevent falls. This information is not intended to replace advice given to you by your health care provider. Make sure you discuss any questions you have with your health care provider. Document Revised: 09/11/2020 Document Reviewed: 09/11/2020 Elsevier Patient Education  2024 [employer].

## 2023-08-15 NOTE — Progress Notes (Signed)
 Subjective:    Patient ID: Tammy Lawson, female    DOB: [birth year], 68 y.o.   MRN: [id number]  Chief Complaint  Patient presents with   Medical Management of Chronic Issues   PT presents to the office today for CPE and chronic follow up.  She is considered morbid obese with a BMI of 35 and co morbility of HTN.     Complaining of increased redness and breakouts on face that comes and goes.  Hypertension This is a chronic problem. The current episode started more than 1 year ago. The problem has been resolved since onset. The problem is controlled. Associated symptoms include peripheral edema (right that comes and goes). Pertinent negatives include no malaise/fatigue or shortness of breath. Risk factors for coronary artery disease include dyslipidemia, obesity and sedentary lifestyle. The current treatment provides moderate improvement.  Hyperlipidemia This is a chronic problem. The current episode started more than 1 year ago. The problem is controlled. Recent lipid tests were reviewed and are normal. Exacerbating diseases include obesity. Pertinent negatives include no shortness of breath. Current antihyperlipidemic treatment includes statins. The current treatment provides moderate improvement of lipids. Risk factors for coronary artery disease include dyslipidemia, hypertension, a sedentary lifestyle and post-menopausal.  Diabetes She presents for her follow-up diabetic visit. Diabetes type: prediabetic. Pertinent negatives for diabetes include no foot paresthesias and no foot ulcerations. Risk factors for coronary artery disease include dyslipidemia, diabetes mellitus, hypertension, sedentary lifestyle and post-menopausal. She is following a generally healthy diet. Her overall blood glucose range is 110-130 mg/dl. Eye exam is current.      Review of Systems  Constitutional:  Negative for malaise/fatigue.  Respiratory:  Negative for shortness of breath.   All other systems reviewed and are  negative.  Family History  Problem Relation Age of Onset   Diabetes Mother    Heart disease Mother    Stroke Mother    Hypertension Mother    Hyperlipidemia Mother    Arrhythmia Father    COPD Father    Rectal cancer Father    Colon polyps Father    Atrial fibrillation Father    Hyperlipidemia Father    Prostate cancer Father    Lung disease Father        tumor on lung   Cancer Sister        Mets and don't know origin   Albinism Sister    Pulmonary fibrosis Sister    Other Sister        Hermansky pudlac syndrome   Diabetes Paternal Grandmother    Diabetes Son    Hypertension Son    Hyperlipidemia Son    Esophageal cancer Neg Hx    Stomach cancer Neg Hx    Breast cancer Neg Hx    Social History   Socioeconomic History   Marital status: Married    Spouse name: Justin Mend   Number of children: 1   Years of education: 12   Highest education level: 12th grade  Occupational History   Occupation: retired   Occupation: Retired Engineer, site  Tobacco Use   Smoking status: Never   Smokeless tobacco: Never  Vaping Use   Vaping status: Never Used  Substance and Sexual Activity   Alcohol use: No   Drug use: No   Sexual activity: Yes  Other Topics Concern   Not on file  Social History Narrative   Son lives 25 miles away   Lives with her husband in one level home   Social Drivers  of Health   Financial Resource Strain: Low Risk  (08/14/2023)   Overall Financial Resource Strain (CARDIA)    Difficulty of Paying Living Expenses: Not hard at all  Food Insecurity: No Food Insecurity (08/14/2023)   Hunger Vital Sign    Worried About Running Out of Food in the Last Year: Never true    Ran Out of Food in the Last Year: Never true  Transportation Needs: No Transportation Needs (08/14/2023)   PRAPARE - Administrator, Civil Service (Medical): No    Lack of Transportation (Non-Medical): No  Physical Activity: Sufficiently Active (08/14/2023)   Exercise Vital Sign     Days of Exercise per Week: 7 days    Minutes of Exercise per Session: 50 min  Stress: No Stress Concern Present (08/14/2023)   [employer] of Occupational Health - Occupational Stress Questionnaire    Feeling of Stress : Only a little  Social Connections: Socially Integrated (08/14/2023)   Social Connection and Isolation Panel [NHANES]    Frequency of Communication with Friends and Family: More than three times a week    Frequency of Social Gatherings with Friends and Family: Twice a week    Attends Religious Services: More than 4 times per year    Active Member of [employer] or Organizations: Yes    Attends Engineer, structural: More than 4 times per year    Marital Status: Married        Objective:   Physical Exam Vitals reviewed.  Constitutional:      General: She is not in acute distress.    Appearance: She is well-developed. She is obese.  HENT:     Head: Normocephalic and atraumatic.     Right Ear: Tympanic membrane normal.     Left Ear: Tympanic membrane normal.  Eyes:     Pupils: Pupils are equal, round, and reactive to light.  Neck:     Thyroid: No thyromegaly.  Cardiovascular:     Rate and Rhythm: Normal rate and regular rhythm.     Heart sounds: Normal heart sounds. No murmur heard. Pulmonary:     Effort: Pulmonary effort is normal. No respiratory distress.     Breath sounds: Normal breath sounds. No wheezing.  Abdominal:     General: Bowel sounds are normal. There is no distension.     Palpations: Abdomen is soft.     Tenderness: There is no abdominal tenderness.  Musculoskeletal:        General: No tenderness. Normal range of motion.     Cervical back: Normal range of motion and neck supple.  Skin:    General: Skin is warm and dry.     Comments: Varicose veins in right leg, mild erythemas in lower right leg.   Neurological:     Mental Status: She is alert and oriented to person, place, and time.     Cranial Nerves: No cranial nerve deficit.      Deep Tendon Reflexes: Reflexes are normal and symmetric.  Psychiatric:        Behavior: Behavior normal.        Thought Content: Thought content normal.        Judgment: Judgment normal.       BP 133/79   Pulse 90   Temp 98 F (36.7 C) (Temporal)   Ht 5\' 2"  (1.575 m)   Wt 193 lb (87.5 kg)   SpO2 98%   BMI 35.30 kg/m      Assessment & Plan:  Kaysia Willard comes in today with chief complaint of Medical Management of Chronic Issues   Diagnosis and orders addressed:  1. Essential hypertension - losartan (COZAAR) 50 MG tablet; Take 1 tablet (50 mg total) by mouth daily.  Dispense: 90 tablet; Refill: 1 - CMP14+EGFR - CBC with Differential/Platelet  2. Pure hypercholesterolemia - rosuvastatin (CRESTOR) 10 MG tablet; Take 1 tablet (10 mg total) by mouth daily.  Dispense: 90 tablet; Refill: 1 - CMP14+EGFR - CBC with Differential/Platelet - Lipid panel  3. Prediabetes - Bayer DCA Hb A1c Waived - CMP14+EGFR - CBC with Differential/Platelet  4. Vitamin D deficiency  - CMP14+EGFR - CBC with Differential/Platelet - VITAMIN D 25 Hydroxy (Vit-D Deficiency, Fractures)  5. Morbid obesity (HCC) - CMP14+EGFR - CBC with Differential/Platelet  6. Osteopenia of multiple sites - CMP14+EGFR - CBC with Differential/Platelet  7. Annual physical exam (Primary) - Bayer DCA Hb A1c Waived - CMP14+EGFR - CBC with Differential/Platelet - Lipid panel   Labs pending Continue current medications  Health Maintenance reviewed Diet and exercise encouraged  Follow up plan: 6 months    Jannifer Rodney, FNP

## 2023-08-16 LAB — CMP14+EGFR
ALT: 12 IU/L (ref 0–32)
AST: 18 IU/L (ref 0–40)
Albumin: 4.6 g/dL (ref 3.9–4.9)
Alkaline Phosphatase: 79 IU/L (ref 44–121)
BUN/Creatinine Ratio: 24 (ref 12–28)
BUN: 19 mg/dL (ref 8–27)
Bilirubin Total: 0.6 mg/dL (ref 0.0–1.2)
CO2: 23 mmol/L (ref 20–29)
Calcium: 9.9 mg/dL (ref 8.7–10.3)
Chloride: 103 mmol/L (ref 96–106)
Creatinine, Ser: 0.78 mg/dL (ref 0.57–1.00)
Globulin, Total: 2.4 g/dL (ref 1.5–4.5)
Glucose: 102 mg/dL — ABNORMAL HIGH (ref 70–99)
Potassium: 5.2 mmol/L (ref 3.5–5.2)
Sodium: 142 mmol/L (ref 134–144)
Total Protein: 7 g/dL (ref 6.0–8.5)
eGFR: 83 mL/min/{1.73_m2} (ref >59)

## 2023-08-16 LAB — CBC WITH DIFFERENTIAL/PLATELET
Basophils Absolute: 0 10*3/uL (ref 0.0–0.2)
Basos: 1 %
EOS (ABSOLUTE): 0.1 10*3/uL (ref 0.0–0.4)
Eos: 2 %
Hematocrit: 44.9 % (ref 34.0–46.6)
Hemoglobin: 14 g/dL (ref 11.1–15.9)
Immature Grans (Abs): 0 10*3/uL (ref 0.0–0.1)
Immature Granulocytes: 0 %
Lymphocytes Absolute: 1.2 10*3/uL (ref 0.7–3.1)
Lymphs: 19 %
MCH: 29.6 pg (ref 26.6–33.0)
MCHC: 31.2 g/dL — ABNORMAL LOW (ref 31.5–35.7)
MCV: 95 fL (ref 79–97)
Monocytes Absolute: 0.3 10*3/uL (ref 0.1–0.9)
Monocytes: 5 %
Neutrophils Absolute: 4.5 10*3/uL (ref 1.4–7.0)
Neutrophils: 73 %
Platelets: 213 10*3/uL (ref 150–450)
RBC: 4.73 x10E6/uL (ref 3.77–5.28)
RDW: 13 % (ref 11.7–15.4)
WBC: 6.1 10*3/uL (ref 3.4–10.8)

## 2023-08-16 LAB — LIPID PANEL
Chol/HDL Ratio: 2.5 ratio (ref 0.0–4.4)
Cholesterol, Total: 130 mg/dL (ref 100–199)
HDL: 51 mg/dL (ref >39)
LDL Chol Calc (NIH): 63 mg/dL (ref 0–99)
Triglycerides: 80 mg/dL (ref 0–149)
VLDL Cholesterol Cal: 16 mg/dL (ref 5–40)

## 2023-08-16 LAB — VITAMIN D 25 HYDROXY (VIT D DEFICIENCY, FRACTURES): Vit D, 25-Hydroxy: 63.6 ng/mL (ref 30.0–100.0)

## 2023-08-27 DIAGNOSIS — M9903 Segmental and somatic dysfunction of lumbar region: Secondary | ICD-10-CM | POA: Diagnosis not present

## 2023-08-27 DIAGNOSIS — M5432 Sciatica, left side: Secondary | ICD-10-CM | POA: Diagnosis not present

## 2023-09-01 DIAGNOSIS — H25012 Cortical age-related cataract, left eye: Secondary | ICD-10-CM | POA: Diagnosis not present

## 2023-09-01 DIAGNOSIS — H2512 Age-related nuclear cataract, left eye: Secondary | ICD-10-CM | POA: Diagnosis not present

## 2023-09-01 DIAGNOSIS — H25042 Posterior subcapsular polar age-related cataract, left eye: Secondary | ICD-10-CM | POA: Diagnosis not present

## 2023-09-16 ENCOUNTER — Ambulatory Visit (INDEPENDENT_AMBULATORY_CARE_PROVIDER_SITE_OTHER): Payer: PPO

## 2023-09-16 VITALS — BP 133/79 | HR 90 | Ht 62.0 in | Wt 193.0 lb

## 2023-09-16 DIAGNOSIS — Z0000 Encounter for general adult medical examination without abnormal findings: Secondary | ICD-10-CM | POA: Diagnosis not present

## 2023-09-16 NOTE — Patient Instructions (Signed)
 Tammy Lawson , Thank you for taking time out of your busy schedule to complete your Annual Wellness Visit with me. I enjoyed our conversation and look forward to speaking with you again next year. I, as well as your care team,  appreciate your ongoing commitment to your health goals. Please review the following plan we discussed and let me know if I can assist you in the future. Your Game plan/ To Do List    Follow up Visits: Next Medicare AWV with our clinical staff: Thursday, 09/16/24 at 11:20a.m.   Next Office Visit with your provider: 02/16/24 at 10:10a.m.  Clinician Recommendations:  Aim for 30 minutes of exercise or brisk walking, 6-8 glasses of water, and 5 servings of fruits and vegetables each day. N/a      This is a list of the screening recommended for you and due dates:  Health Maintenance  Topic Date Due   Zoster (Shingles) Vaccine (1 of 2) Never done   COVID-19 Vaccine (3 - 2024-25 season) 10/01/2024*   Flu Shot  12/05/2023   DEXA scan (bone density measurement)  02/09/2024   Mammogram  08/27/2024   Medicare Annual Wellness Visit  09/15/2024   Colon Cancer Screening  06/03/2027   DTaP/Tdap/Td vaccine (2 - Td or Tdap) 01/19/2029   Pneumonia Vaccine  Completed   Hepatitis C Screening  Completed   HPV Vaccine  Aged Out   Meningitis B Vaccine  Aged Out  *Topic was postponed. The date shown is not the original due date.    Advanced directives: (Declined) Advance directive discussed with you today. Even though you declined this today, please call our office should you change your mind, and we can give you the proper paperwork for you to fill out. Advance Care Planning is important because it:  [x]  Makes sure you receive the medical care that is consistent with your values, goals, and preferences  [x]  It provides guidance to your family and loved ones and reduces their decisional burden about whether or not they are making the right decisions based on your wishes.  Follow the  link provided in your after visit summary or read over the paperwork we have mailed to you to help you started getting your Advance Directives in place. If you need assistance in completing these, please reach out to us  so that we can help you!  See attachments for Preventive Care and Fall Prevention Tips.

## 2023-09-16 NOTE — Progress Notes (Signed)
 Subjective:   Tammy Lawson is a 68 y.o. who presents for a Medicare Wellness preventive visit.  As a reminder, Annual Wellness Visits don't include a physical exam, and some assessments may be limited, especially if this visit is performed virtually. We may recommend an in-person visit if needed.  Visit Complete: Virtual I connected with  Tammy Lawson on 09/16/23 by a audio enabled telemedicine application and verified that I am speaking with the correct person using two identifiers.  Patient Location: Home  Provider Location: Home Office  I discussed the limitations of evaluation and management by telemedicine. The patient expressed understanding and agreed to proceed.  Vital Signs: Because this visit was a virtual/telehealth visit, some criteria may be missing or patient reported. Any vitals not documented were not able to be obtained and vitals that have been documented are patient reported.  VideoDeclined- This patient declined Librarian, academic. Therefore the visit was completed with audio only.  Persons Participating in Visit: Patient.  AWV Questionnaire: Yes: Patient Medicare AWV questionnaire was completed by the patient on 09/15/23; I have confirmed that all information answered by patient is correct and no changes since this date.  Cardiac Risk Factors include: advanced age (>1men, >71 women);dyslipidemia;obesity (BMI >30kg/m2)     Objective:     Today's Vitals   09/16/23 1559  BP: 133/79  Pulse: 90  Weight: 193 lb (87.5 kg)  Height: 5\' 2"  (1.575 m)   Body mass index is 35.3 kg/m.     11/17/2022    8:51 AM 11/16/2022    2:27 PM 09/13/2022    1:26 PM 09/11/2021    1:25 PM 09/07/2020    9:12 AM 06/02/2020   10:03 AM  Advanced Directives  Does Patient Have a Medical Advance Directive?  No No No No No  Would patient like information on creating a medical advance directive? No - Patient declined No - Patient declined No - Patient declined  Yes (MAU/Ambulatory/Procedural Areas - Information given) No - Patient declined No - Patient declined    Current Medications (verified) Outpatient Encounter Medications as of 09/16/2023  Medication Sig   Ascorbic Acid (VITAMIN C) 100 MG tablet Take 1,000 mg by mouth daily.   CALCIUM -MAGNESIUM-ZINC PO Take 1 capsule by mouth once a week.   clobetasol  cream (TEMOVATE ) 0.05 % Apply 1 Application topically daily.   Continuous Glucose Sensor (FREESTYLE LIBRE 3 PLUS SENSOR) MISC Change sensor every 15 days.   Continuous Glucose Sensor (FREESTYLE LIBRE 3 SENSOR) MISC APPLY SENSOR EVERY 14 DAYS AS DIRECTED   losartan  (COZAAR ) 50 MG tablet Take 1 tablet (50 mg total) by mouth daily.   OVER THE COUNTER MEDICATION Cranberry Supplement 500g Per pt takes 1 daily   rosuvastatin  (CRESTOR ) 10 MG tablet Take 1 tablet (10 mg total) by mouth daily.   Vitamin D , Ergocalciferol , (DRISDOL ) 1.25 MG (50000 UNIT) CAPS capsule TAKE 1 CAPSULE BY MOUTH ONE TIME PER WEEK   No facility-administered encounter medications on file as of 09/16/2023.    Allergies (verified) Bactrim  [sulfamethoxazole -trimethoprim ], Other, Rocephin  [ceftriaxone ], and Augmentin  [amoxicillin -pot clavulanate]   History: Past Medical History:  Diagnosis Date   Allergy 2018   Cataract    HLD (hyperlipidemia)    HTN (hypertension)    Osteopenia    Vitamin D  deficiency    Past Surgical History:  Procedure Laterality Date   EYE SURGERY  08/11/2023   Surgery to remove cataract on right eye   NO PAST SURGERIES     Family  History  Problem Relation Age of Onset   Diabetes Mother    Heart disease Mother    Stroke Mother    Hypertension Mother    Hyperlipidemia Mother    Arrhythmia Father    COPD Father    Rectal cancer Father    Colon polyps Father    Atrial fibrillation Father    Hyperlipidemia Father    Prostate cancer Father    Lung disease Father        tumor on lung   Cancer Sister        Mets and don't know origin    Albinism Sister    Pulmonary fibrosis Sister    Other Sister        Hermansky pudlac syndrome   Diabetes Paternal Grandmother    Diabetes Son    Hypertension Son    Hyperlipidemia Son    Esophageal cancer Neg Hx    Stomach cancer Neg Hx    Breast cancer Neg Hx    Social History   Socioeconomic History   Marital status: Married    Spouse name: Tammy Lawson   Number of children: 1   Years of education: 12   Highest education level: 12th grade  Occupational History   Occupation: retired   Occupation: Retired Engineer, site  Tobacco Use   Smoking status: Never   Smokeless tobacco: Never  Vaping Use   Vaping status: Never Used  Substance and Sexual Activity   Alcohol use: Never   Drug use: Never   Sexual activity: Yes    Birth control/protection: None  Other Topics Concern   Not on file  Social History Narrative   Son lives 25 miles away   Lives with her husband in one level home   Social Drivers of Health   Financial Resource Strain: Low Risk  (09/16/2023)   Overall Financial Resource Strain (CARDIA)    Difficulty of Paying Living Expenses: Not hard at all  Food Insecurity: No Food Insecurity (09/16/2023)   Hunger Vital Sign    Worried About Running Out of Food in the Last Year: Never true    Ran Out of Food in the Last Year: Never true  Transportation Needs: No Transportation Needs (09/16/2023)   PRAPARE - Administrator, Civil Service (Medical): No    Lack of Transportation (Non-Medical): No  Physical Activity: Sufficiently Active (09/16/2023)   Exercise Vital Sign    Days of Exercise per Week: 7 days    Minutes of Exercise per Session: 50 min  Stress: No Stress Concern Present (09/16/2023)   [employer] of Occupational Health - Occupational Stress Questionnaire    Feeling of Stress : Only a little  Social Connections: Socially Integrated (09/16/2023)   Social Connection and Isolation Panel [NHANES]    Frequency of Communication with Friends and  Family: More than three times a week    Frequency of Social Gatherings with Friends and Family: More than three times a week    Attends Religious Services: More than 4 times per year    Active Member of [employer] or Organizations: Yes    Attends Engineer, structural: More than 4 times per year    Marital Status: Married    Tobacco Counseling Counseling given: Yes    Clinical Intake:  Pre-visit preparation completed: Yes  Pain : No/denies pain     Nutritional Risks: None Diabetes: No  Lab Results  Component Value Date   HGBA1C 5.8 (H) 08/15/2023   HGBA1C 6.0 (  H) 11/17/2022   HGBA1C 5.8 (H) 02/08/2022     How often do you need to have someone help you when you read instructions, pamphlets, or other written materials from your doctor or pharmacy?: 1 - Never  Interpreter Needed?: No  Information entered by :: Alia T/cma   Activities of Daily Living     09/15/2023    9:41 AM 11/17/2022    2:02 AM  In your present state of health, do you have any difficulty performing the following activities:  Hearing? 0 0  Vision? 0 0  Difficulty concentrating or making decisions? 0 0  Walking or climbing stairs? 0 0  Dressing or bathing? 0 0  Doing errands, shopping? 0 0  Preparing Food and eating ? N   Using the Toilet? N   In the past six months, have you accidently leaked urine? Y   Do you have problems with loss of bowel control? N   Managing your Medications? N   Managing your Finances? N   Housekeeping or managing your Housekeeping? N     Patient Care Team: Yevette Hem, FNP as PCP - General (Family Medicine) Nandigam, Kavitha V, MD as Consulting Physician (Gastroenterology) Ranny Bye, OD as Referring Physician (Optometry)  Indicate any recent Medical Services you may have received from other than [facility] providers in the past year (date may be approximate).     Assessment:    This is a routine wellness examination for Tammy Lawson.  Hearing/Vision  screen Hearing Screening - Comments:: Pt denies hearing dif Vision Screening - Comments:: Pt denies vision dif., pt goes to Dr. Murrel Arnt in [place], [place]   Goals Addressed             This Visit's Progress    Patient Stated   On track    Continue exercising, staying active, eating healthy and lose some weight        Depression Screen     09/16/2023   10:51 AM 12/03/2022   10:44 AM 11/11/2022    2:28 PM 09/13/2022    1:25 PM 08/13/2022    9:24 AM 02/08/2022    9:40 AM 09/11/2021    1:22 PM  PHQ 2/9 Scores  PHQ - 2 Score 0 0 0 0 0 0 0  PHQ- 9 Score 1  0 0 0 0     Fall Risk     09/15/2023    9:41 AM 11/11/2022    2:28 PM 09/13/2022    1:24 PM 09/10/2022   12:42 PM 08/13/2022    9:22 AM  Fall Risk   Falls in the past year? 1 0 0 1 1  Number falls in past yr: 0 0 0 0 0  Injury with Fall? 0 0 0 1 1  Risk for fall due to :  No Fall Risks No Fall Risks  Impaired balance/gait  Follow up  Falls evaluation completed Falls prevention discussed  Falls evaluation completed;Education provided    MEDICARE RISK AT HOME:  Medicare Risk at Home Any stairs in or around the home?: (Patient-Rptd) No Home free of loose throw rugs in walkways, pet beds, electrical cords, etc?: (Patient-Rptd) Yes Adequate lighting in your home to reduce risk of falls?: (Patient-Rptd) Yes Life alert?: (Patient-Rptd) No Use of a cane, walker or w/c?: (Patient-Rptd) Yes Grab bars in the bathroom?: (Patient-Rptd) No Shower chair or bench in shower?: (Patient-Rptd) No Elevated toilet seat or a handicapped toilet?: (Patient-Rptd) No  TIMED UP AND GO:  Was the  test performed?  no  Cognitive Function: 6CIT completed    09/07/2020    9:12 AM  MMSE - Mini Mental State Exam  Orientation to time 5  Orientation to Place 2  Registration 3  Attention/ Calculation 5  Recall 3  Language- name 2 objects 2  Language- repeat 1  Language- follow 3 step command 3  Language- read & follow direction 1  Write a sentence 1  Copy  design 1  Total score 27        09/16/2023   10:53 AM 09/13/2022    1:26 PM 09/11/2021    1:24 PM  6CIT Screen  What Year? 0 points 0 points 0 points  What month? 0 points 0 points 0 points  What time? 0 points 0 points 0 points  Count back from 20 0 points 0 points 0 points  Months in reverse 0 points 0 points 0 points  Repeat phrase 0 points 0 points 0 points  Total Score 0 points 0 points 0 points    Immunizations Immunization History  Administered Date(s) Administered   Fluad Quad(high Dose 65+) 02/08/2021, 02/08/2022   PFIZER(Purple Top)SARS-COV-2 Vaccination 11/08/2019, 11/29/2019   PNEUMOCOCCAL CONJUGATE-20 08/15/2023   Pneumococcal Conjugate-13 09/07/2020   Tdap 01/20/2019    Screening Tests Health Maintenance  Topic Date Due   Zoster Vaccines- Shingrix (1 of 2) Never done   COVID-19 Vaccine (3 - 2024-25 season) 10/01/2024 (Originally 01/05/2023)   INFLUENZA VACCINE  12/05/2023   DEXA SCAN  02/09/2024   MAMMOGRAM  08/27/2024   Medicare Annual Wellness (AWV)  09/15/2024   Colonoscopy  06/03/2027   DTaP/Tdap/Td (2 - Td or Tdap) 01/19/2029   Pneumonia Vaccine 46+ Years old  Completed   Hepatitis C Screening  Completed   HPV VACCINES  Aged Out   Meningococcal B Vaccine  Aged Out    Health Maintenance  Health Maintenance Due  Topic Date Due   Zoster Vaccines- Shingrix (1 of 2) Never done   Health Maintenance Items Addressed: See Nurse Notes  Additional Screening:  Vision Screening: Recommended annual ophthalmology exams for early detection of glaucoma and other disorders of the eye.  Dental Screening: Recommended annual dental exams for proper oral hygiene  Community Resource Referral / Chronic Care Management: CRR required this visit?  No   CCM required this visit?  No   Plan:    I have personally reviewed and noted the following in the patient's chart:   Medical and social history Use of alcohol, tobacco or illicit drugs  Current medications  and supplements including opioid prescriptions. Patient is not currently taking opioid prescriptions. Functional ability and status Nutritional status Physical activity Advanced directives List of other physicians Hospitalizations, surgeries, and ER visits in previous 12 months Vitals Screenings to include cognitive, depression, and falls Referrals and appointments  In addition, I have reviewed and discussed with patient certain preventive protocols, quality metrics, and best practice recommendations. A written personalized care plan for preventive services as well as general preventive health recommendations were provided to patient.   Michaelle Adolphus, CMA   09/16/2023   After Visit Summary: (MyChart) Due to this being a telephonic visit, the after visit summary with patients personalized plan was offered to patient via MyChart   Notes: pt is aware and encourage to get shingles vaccine.

## 2023-09-17 DIAGNOSIS — M5432 Sciatica, left side: Secondary | ICD-10-CM | POA: Diagnosis not present

## 2023-09-17 DIAGNOSIS — M9903 Segmental and somatic dysfunction of lumbar region: Secondary | ICD-10-CM | POA: Diagnosis not present

## 2023-10-08 DIAGNOSIS — M5432 Sciatica, left side: Secondary | ICD-10-CM | POA: Diagnosis not present

## 2023-10-08 DIAGNOSIS — M9903 Segmental and somatic dysfunction of lumbar region: Secondary | ICD-10-CM | POA: Diagnosis not present

## 2023-10-29 DIAGNOSIS — M9903 Segmental and somatic dysfunction of lumbar region: Secondary | ICD-10-CM | POA: Diagnosis not present

## 2023-10-29 DIAGNOSIS — M5432 Sciatica, left side: Secondary | ICD-10-CM | POA: Diagnosis not present

## 2023-11-19 DIAGNOSIS — M9903 Segmental and somatic dysfunction of lumbar region: Secondary | ICD-10-CM | POA: Diagnosis not present

## 2023-11-19 DIAGNOSIS — M5432 Sciatica, left side: Secondary | ICD-10-CM | POA: Diagnosis not present

## 2023-12-14 ENCOUNTER — Other Ambulatory Visit: Payer: Self-pay | Admitting: Family

## 2023-12-14 DIAGNOSIS — E559 Vitamin D deficiency, unspecified: Secondary | ICD-10-CM

## 2023-12-15 DIAGNOSIS — M5432 Sciatica, left side: Secondary | ICD-10-CM | POA: Diagnosis not present

## 2023-12-15 DIAGNOSIS — M9903 Segmental and somatic dysfunction of lumbar region: Secondary | ICD-10-CM | POA: Diagnosis not present

## 2023-12-15 NOTE — Telephone Encounter (Signed)
 Last OV 08/15/23. Last RF 07/04/23. Next OV 02/16/24

## 2024-01-06 DIAGNOSIS — M5432 Sciatica, left side: Secondary | ICD-10-CM | POA: Diagnosis not present

## 2024-01-06 DIAGNOSIS — M9903 Segmental and somatic dysfunction of lumbar region: Secondary | ICD-10-CM | POA: Diagnosis not present

## 2024-01-07 ENCOUNTER — Encounter: Payer: Self-pay | Admitting: *Deleted

## 2024-01-15 ENCOUNTER — Other Ambulatory Visit: Payer: Self-pay | Admitting: Family

## 2024-01-15 DIAGNOSIS — R7303 Prediabetes: Secondary | ICD-10-CM

## 2024-01-26 DIAGNOSIS — M9903 Segmental and somatic dysfunction of lumbar region: Secondary | ICD-10-CM | POA: Diagnosis not present

## 2024-01-26 DIAGNOSIS — M5432 Sciatica, left side: Secondary | ICD-10-CM | POA: Diagnosis not present

## 2024-02-16 ENCOUNTER — Ambulatory Visit: Admitting: Family

## 2024-02-16 DIAGNOSIS — M9903 Segmental and somatic dysfunction of lumbar region: Secondary | ICD-10-CM | POA: Diagnosis not present

## 2024-02-16 DIAGNOSIS — M5432 Sciatica, left side: Secondary | ICD-10-CM | POA: Diagnosis not present

## 2024-02-20 ENCOUNTER — Ambulatory Visit (INDEPENDENT_AMBULATORY_CARE_PROVIDER_SITE_OTHER): Admitting: Family

## 2024-02-20 ENCOUNTER — Encounter: Payer: Self-pay | Admitting: Family

## 2024-02-20 VITALS — BP 139/67 | HR 78 | Temp 97.2°F | Ht 62.0 in | Wt 198.2 lb

## 2024-02-20 DIAGNOSIS — Z23 Encounter for immunization: Secondary | ICD-10-CM

## 2024-02-20 DIAGNOSIS — R7303 Prediabetes: Secondary | ICD-10-CM

## 2024-02-20 DIAGNOSIS — E559 Vitamin D deficiency, unspecified: Secondary | ICD-10-CM

## 2024-02-20 DIAGNOSIS — R232 Flushing: Secondary | ICD-10-CM

## 2024-02-20 DIAGNOSIS — M8589 Other specified disorders of bone density and structure, multiple sites: Secondary | ICD-10-CM

## 2024-02-20 DIAGNOSIS — I10 Essential (primary) hypertension: Secondary | ICD-10-CM | POA: Diagnosis not present

## 2024-02-20 DIAGNOSIS — E7800 Pure hypercholesterolemia, unspecified: Secondary | ICD-10-CM

## 2024-02-20 DIAGNOSIS — M546 Pain in thoracic spine: Secondary | ICD-10-CM | POA: Diagnosis not present

## 2024-02-20 NOTE — Progress Notes (Signed)
 Subjective:    Patient ID: Tammy Lawson, female    DOB: 07-14-1955, 68 y.o.   MRN: [id number]  Chief Complaint  Patient presents with   Medical Management of Chronic Issues   PT presents to the office today for chronic follow up.    She is  morbid obese with a BMI of 36 and co morbility of HTN.     Complaining of increased redness and breakouts on face that comes and goes.   Complaining of hot flashes when she wakes up in the AM. Denies any low glucose.  Hypertension This is a chronic problem. The current episode started more than 1 year ago. The problem has been resolved since onset. The problem is controlled. Pertinent negatives include no blurred vision, headaches, malaise/fatigue, peripheral edema or shortness of breath. Risk factors for coronary artery disease include dyslipidemia, obesity and sedentary lifestyle. The current treatment provides moderate improvement.  Hyperlipidemia This is a chronic problem. The current episode started more than 1 year ago. The problem is controlled. Recent lipid tests were reviewed and are normal. Exacerbating diseases include obesity. Pertinent negatives include no shortness of breath. Current antihyperlipidemic treatment includes statins. The current treatment provides moderate improvement of lipids. Risk factors for coronary artery disease include dyslipidemia, hypertension, a sedentary lifestyle, post-menopausal and obesity.  Diabetes She presents for her follow-up diabetic visit. Diabetes type: prediabetic. Pertinent negatives for hypoglycemia include no headaches. Pertinent negatives for diabetes include no blurred vision, no foot paresthesias, no foot ulcerations and no weakness. Risk factors for coronary artery disease include dyslipidemia, diabetes mellitus, hypertension, sedentary lifestyle, post-menopausal and obesity. She is following a generally healthy diet. Her overall blood glucose range is 110-130 mg/dl. Eye exam is current.  Back  Pain This is a new problem. The current episode started 1 to 4 weeks ago. The problem occurs intermittently. The pain is present in the thoracic spine. The quality of the pain is described as aching. The pain is mild. The symptoms are aggravated by twisting. Pertinent negatives include no dysuria, fever, headaches, perianal numbness, tingling or weakness. Risk factors include obesity. She has tried bed rest for the symptoms. The treatment provided mild relief.      Review of Systems  Constitutional:  Negative for fever and malaise/fatigue.  Eyes:  Negative for blurred vision.  Respiratory:  Negative for shortness of breath.   Genitourinary:  Negative for dysuria.  Musculoskeletal:  Positive for back pain.  Neurological:  Negative for tingling, weakness and headaches.  All other systems reviewed and are negative.  Family History  Problem Relation Age of Onset   Diabetes Mother    Heart disease Mother    Stroke Mother    Hypertension Mother    Hyperlipidemia Mother    Arrhythmia Father    COPD Father    Rectal cancer Father    Colon polyps Father    Atrial fibrillation Father    Hyperlipidemia Father    Prostate cancer Father    Lung disease Father        tumor on lung   Cancer Sister        Mets and don't know origin   Albinism Sister    Pulmonary fibrosis Sister    Other Sister        Hermansky pudlac syndrome   Diabetes Paternal Grandmother    Diabetes Son    Hypertension Son    Hyperlipidemia Son    Esophageal cancer Neg Hx    Stomach cancer Neg Hx  Breast cancer Neg Hx    Social History   Socioeconomic History   Marital status: Married    Spouse name: Tonette   Number of children: 1   Years of education: 12   Highest education level: 12th grade  Occupational History   Occupation: retired   Occupation: Retired Engineer, site  Tobacco Use   Smoking status: Never   Smokeless tobacco: Never  Vaping Use   Vaping status: Never Used  Substance and Sexual  Activity   Alcohol use: Never   Drug use: Never   Sexual activity: Yes    Birth control/protection: None  Other Topics Concern   Not on file  Social History Narrative   Son lives 25 miles away   Lives with her husband in one level home   Social Drivers of Health   Financial Resource Strain: Low Risk  (02/19/2024)   Overall Financial Resource Strain (CARDIA)    Difficulty of Paying Living Expenses: Not hard at all  Food Insecurity: No Food Insecurity (02/19/2024)   Hunger Vital Sign    Worried About Running Out of Food in the Last Year: Never true    Ran Out of Food in the Last Year: Never true  Transportation Needs: No Transportation Needs (02/19/2024)   PRAPARE - Administrator, Civil Service (Medical): No    Lack of Transportation (Non-Medical): No  Physical Activity: Sufficiently Active (02/19/2024)   Exercise Vital Sign    Days of Exercise per Week: 6 days    Minutes of Exercise per Session: 40 min  Stress: No Stress Concern Present (02/19/2024)   [employer] of Occupational Health - Occupational Stress Questionnaire    Feeling of Stress: Not at all  Social Connections: Socially Integrated (02/19/2024)   Social Connection and Isolation Panel    Frequency of Communication with Friends and Family: Twice a week    Frequency of Social Gatherings with Friends and Family: Three times a week    Attends Religious Services: More than 4 times per year    Active Member of Clubs or Organizations: Yes    Attends Banker Meetings: More than 4 times per year    Marital Status: Married        Objective:   Physical Exam Vitals reviewed.  Constitutional:      General: She is not in acute distress.    Appearance: She is well-developed. She is obese.  HENT:     Head: Normocephalic and atraumatic.     Right Ear: Tympanic membrane normal.     Left Ear: Tympanic membrane normal.     Mouth/Throat:     Pharynx: Posterior oropharyngeal erythema present.   Eyes:     Pupils: Pupils are equal, round, and reactive to light.  Neck:     Thyroid : No thyromegaly.  Cardiovascular:     Rate and Rhythm: Normal rate and regular rhythm.     Heart sounds: Normal heart sounds. No murmur heard. Pulmonary:     Effort: Pulmonary effort is normal. No respiratory distress.     Breath sounds: Normal breath sounds. No wheezing.  Abdominal:     General: Bowel sounds are normal. There is no distension.     Palpations: Abdomen is soft.     Tenderness: There is no abdominal tenderness.  Musculoskeletal:        General: No tenderness. Normal range of motion.     Cervical back: Normal range of motion and neck supple.     Comments:  Full ROM, mild right thoracic pain with extension to the right  Skin:    General: Skin is warm and dry.     Comments: Varicose veins in right leg, mild erythemas in lower right leg.   Neurological:     Mental Status: She is alert and oriented to person, place, and time.     Cranial Nerves: No cranial nerve deficit.     Deep Tendon Reflexes: Reflexes are normal and symmetric.  Psychiatric:        Behavior: Behavior normal.        Thought Content: Thought content normal.        Judgment: Judgment normal.       BP 139/67   Pulse 78   Temp (!) 97.2 F (36.2 C) (Temporal)   Ht 5' 2 (1.575 m)   Wt 198 lb 3.2 oz (89.9 kg)   SpO2 99%   BMI 36.25 kg/m      Assessment & Plan:  Soniyah Mcglory comes in today with chief complaint of Medical Management of Chronic Issues   Diagnosis and orders addressed:  1. Essential hypertension (Primary) - CMP14+EGFR - CBC with Differential/Platelet - TSH  2. Morbid obesity (HCC) - CMP14+EGFR - CBC with Differential/Platelet  3. Osteopenia of multiple sites - CMP14+EGFR - CBC with Differential/Platelet  4. Prediabetes - CMP14+EGFR - CBC with Differential/Platelet  5. Pure hypercholesterolemia - CMP14+EGFR - CBC with Differential/Platelet  6. Vitamin D  deficiency -  CMP14+EGFR - CBC with Differential/Platelet  7. Hot flashes - CMP14+EGFR - CBC with Differential/Platelet - TSH   8. Acute right-sided thoracic back pain Motrin  as needed Exercises encouraged, handout given     Labs pending Continue current medications  Health Maintenance reviewed- Shingles given Diet and exercise encouraged  Follow up plan: 6 months    Bari Learn, FNP

## 2024-02-20 NOTE — Patient Instructions (Signed)

## 2024-02-21 LAB — CBC WITH DIFFERENTIAL/PLATELET
Basophils Absolute: 0.1 x10E3/uL (ref 0.0–0.2)
Basos: 1 %
EOS (ABSOLUTE): 0.1 x10E3/uL (ref 0.0–0.4)
Eos: 2 %
Hematocrit: 44.3 % (ref 34.0–46.6)
Hemoglobin: 13.7 g/dL (ref 11.1–15.9)
Immature Grans (Abs): 0 x10E3/uL (ref 0.0–0.1)
Immature Granulocytes: 0 %
Lymphocytes Absolute: 1.2 x10E3/uL (ref 0.7–3.1)
Lymphs: 20 %
MCH: 29.7 pg (ref 26.6–33.0)
MCHC: 30.9 g/dL — ABNORMAL LOW (ref 31.5–35.7)
MCV: 96 fL (ref 79–97)
Monocytes Absolute: 0.4 x10E3/uL (ref 0.1–0.9)
Monocytes: 6 %
Neutrophils Absolute: 4.3 x10E3/uL (ref 1.4–7.0)
Neutrophils: 71 %
Platelets: 212 x10E3/uL (ref 150–450)
RBC: 4.61 x10E6/uL (ref 3.77–5.28)
RDW: 13.2 % (ref 11.7–15.4)
WBC: 6 x10E3/uL (ref 3.4–10.8)

## 2024-02-21 LAB — CMP14+EGFR
ALT: 14 IU/L (ref 0–32)
AST: 19 IU/L (ref 0–40)
Albumin: 4.4 g/dL (ref 3.9–4.9)
Alkaline Phosphatase: 71 IU/L (ref 49–135)
BUN/Creatinine Ratio: 26 (ref 12–28)
BUN: 18 mg/dL (ref 8–27)
Bilirubin Total: 0.4 mg/dL (ref 0.0–1.2)
CO2: 24 mmol/L (ref 20–29)
Calcium: 9.2 mg/dL (ref 8.7–10.3)
Chloride: 104 mmol/L (ref 96–106)
Creatinine, Ser: 0.69 mg/dL (ref 0.57–1.00)
Globulin, Total: 2.4 g/dL (ref 1.5–4.5)
Glucose: 107 mg/dL — AB (ref 70–99)
Potassium: 4.8 mmol/L (ref 3.5–5.2)
Sodium: 143 mmol/L (ref 134–144)
Total Protein: 6.8 g/dL (ref 6.0–8.5)
eGFR: 94 mL/min/1.73 (ref >59)

## 2024-02-21 LAB — TSH: TSH: 0.845 u[IU]/mL (ref 0.450–4.500)

## 2024-02-23 ENCOUNTER — Ambulatory Visit: Payer: Self-pay | Admitting: Family

## 2024-03-06 ENCOUNTER — Other Ambulatory Visit: Payer: Self-pay | Admitting: Family

## 2024-03-06 DIAGNOSIS — E7800 Pure hypercholesterolemia, unspecified: Secondary | ICD-10-CM

## 2024-03-15 DIAGNOSIS — M9903 Segmental and somatic dysfunction of lumbar region: Secondary | ICD-10-CM | POA: Diagnosis not present

## 2024-03-15 DIAGNOSIS — M5432 Sciatica, left side: Secondary | ICD-10-CM | POA: Diagnosis not present

## 2024-04-05 DIAGNOSIS — M9903 Segmental and somatic dysfunction of lumbar region: Secondary | ICD-10-CM | POA: Diagnosis not present

## 2024-04-05 DIAGNOSIS — M5432 Sciatica, left side: Secondary | ICD-10-CM | POA: Diagnosis not present

## 2024-04-21 ENCOUNTER — Other Ambulatory Visit: Payer: Self-pay | Admitting: Family

## 2024-04-21 DIAGNOSIS — I10 Essential (primary) hypertension: Secondary | ICD-10-CM

## 2024-05-29 ENCOUNTER — Other Ambulatory Visit: Payer: Self-pay | Admitting: Family

## 2024-05-29 DIAGNOSIS — E559 Vitamin D deficiency, unspecified: Secondary | ICD-10-CM

## 2024-08-20 ENCOUNTER — Ambulatory Visit: Payer: Self-pay | Admitting: Family
